# Patient Record
Sex: Female | Born: 1975 | Race: White | Hispanic: No | Marital: Single | Smoking: Never smoker
Health system: Southern US, Community
[De-identification: ages and names within clinical notes are randomized; demographics above are authoritative.]

## PROBLEM LIST (undated history)

## (undated) DIAGNOSIS — R7303 Prediabetes: Secondary | ICD-10-CM

## (undated) DIAGNOSIS — R002 Palpitations: Secondary | ICD-10-CM

## (undated) DIAGNOSIS — E119 Type 2 diabetes mellitus without complications: Secondary | ICD-10-CM

## (undated) DIAGNOSIS — J45909 Unspecified asthma, uncomplicated: Secondary | ICD-10-CM

## (undated) DIAGNOSIS — K219 Gastro-esophageal reflux disease without esophagitis: Secondary | ICD-10-CM

## (undated) HISTORY — DX: Type 2 diabetes mellitus without complications: E11.9

## (undated) HISTORY — PX: CATARACT EXTRACTION: SUR2

## (undated) HISTORY — DX: Palpitations: R00.2

## (undated) HISTORY — DX: Prediabetes: R73.03

---
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
## 2017-09-09 ENCOUNTER — Other Ambulatory Visit (HOSPITAL_COMMUNITY): Payer: Self-pay | Admitting: Family Medicine

## 2017-09-09 DIAGNOSIS — Z1231 Encounter for screening mammogram for malignant neoplasm of breast: Secondary | ICD-10-CM

## 2017-09-29 ENCOUNTER — Other Ambulatory Visit: Payer: Self-pay

## 2017-09-29 ENCOUNTER — Ambulatory Visit (INDEPENDENT_AMBULATORY_CARE_PROVIDER_SITE_OTHER): Payer: Commercial Managed Care - PPO | Admitting: Cardiovascular Disease

## 2017-09-29 ENCOUNTER — Encounter: Payer: Self-pay | Admitting: Cardiovascular Disease

## 2017-09-29 VITALS — BP 124/76 | HR 101 | Ht 65.0 in | Wt 237.0 lb

## 2017-09-29 DIAGNOSIS — Z566 Other physical and mental strain related to work: Secondary | ICD-10-CM | POA: Diagnosis not present

## 2017-09-29 DIAGNOSIS — R002 Palpitations: Secondary | ICD-10-CM | POA: Diagnosis not present
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
## 2017-09-29 NOTE — Patient Instructions (Signed)
Medication Instructions:  Continue all current medications.  Labwork: none  Testing/Procedures:  Your physician has requested that you have an echocardiogram. Echocardiography is a painless test that uses sound waves to create images of your heart. It provides your doctor with information about the size and shape of your heart and how well your heart's chambers and valves are working. This procedure takes approximately one hour. There are no restrictions for this procedure.  Your physician has recommended that you wear a 7 day event monitor. Event monitors are medical devices that record the heart's electrical activity. Doctors most often [place] these monitors to diagnose arrhythmias. Arrhythmias are problems with the speed or rhythm of the heartbeat. The monitor is a small, portable device. You can wear one while you do your normal daily activities. This is usually used to diagnose what is causing palpitations/syncope (passing out).  Office will contact with results via phone or letter.    Follow-Up: Next available   Any Other Special Instructions Will Be Listed Below (If Applicable).  If you need a refill on your cardiac medications before your next appointment, please call your pharmacy.

## 2017-09-30 ENCOUNTER — Other Ambulatory Visit: Payer: Self-pay | Admitting: Cardiovascular Disease

## 2017-09-30 DIAGNOSIS — I493 Ventricular premature depolarization: Secondary | ICD-10-CM

## 2017-10-07 ENCOUNTER — Other Ambulatory Visit: Payer: Commercial Managed Care - PPO

## 2017-10-08 ENCOUNTER — Ambulatory Visit (INDEPENDENT_AMBULATORY_CARE_PROVIDER_SITE_OTHER): Payer: Commercial Managed Care - PPO

## 2017-10-08 ENCOUNTER — Other Ambulatory Visit: Payer: Self-pay

## 2017-10-08 DIAGNOSIS — I493 Ventricular premature depolarization: Secondary | ICD-10-CM | POA: Diagnosis not present

## 2017-10-13 ENCOUNTER — Telehealth: Payer: Self-pay | Admitting: *Deleted

## 2017-10-13 ENCOUNTER — Ambulatory Visit (INDEPENDENT_AMBULATORY_CARE_PROVIDER_SITE_OTHER): Payer: Commercial Managed Care - PPO

## 2017-10-13 DIAGNOSIS — R002 Palpitations: Secondary | ICD-10-CM | POA: Diagnosis not present

## 2017-10-13 NOTE — Telephone Encounter (Signed)
ECHO -  Notes recorded by Arnoldo Lenis, MD on 10/09/2017 at 1:01 PM EDT Normal echo  Zandra Abts MD  LABS -  Notes recorded by Herminio Commons, MD on 10/12/2017 at 11:15 AM EDT Good.

## 2017-10-13 NOTE — Telephone Encounter (Signed)
Notes recorded by Laurine Blazer, LPN on 6/61/9694 at 0:98 PM EDT Patient notified. Copy to pmd. Follow up scheduled for October with Dr. Bronson Ing.

## 2017-10-16 ENCOUNTER — Ambulatory Visit: Payer: Commercial Managed Care - PPO | Admitting: Cardiovascular Disease

## 2017-10-26 ENCOUNTER — Ambulatory Visit (HOSPITAL_COMMUNITY): Payer: Commercial Managed Care - PPO

## 2017-10-30 ENCOUNTER — Ambulatory Visit (HOSPITAL_COMMUNITY): Payer: Commercial Managed Care - PPO

## 2017-11-05 ENCOUNTER — Encounter (HOSPITAL_COMMUNITY): Payer: Self-pay | Admitting: Radiology

## 2017-11-05 ENCOUNTER — Ambulatory Visit (HOSPITAL_COMMUNITY)
Admission: RE | Admit: 2017-11-05 | Discharge: 2017-11-05 | Disposition: A | Discharge status: Home / Self Care | Payer: Commercial Managed Care - PPO | Source: Ambulatory Visit | Attending: Family Medicine | Admitting: Family Medicine

## 2017-11-05 DIAGNOSIS — Z1231 Encounter for screening mammogram for malignant neoplasm of breast: Secondary | ICD-10-CM | POA: Diagnosis present

## 2017-11-09 ENCOUNTER — Encounter: Payer: Self-pay | Admitting: *Deleted

## 2017-11-10 ENCOUNTER — Telehealth: Payer: Self-pay | Admitting: *Deleted

## 2017-11-10 ENCOUNTER — Encounter: Payer: Self-pay | Admitting: Cardiovascular Disease

## 2017-11-10 ENCOUNTER — Ambulatory Visit (INDEPENDENT_AMBULATORY_CARE_PROVIDER_SITE_OTHER): Payer: Commercial Managed Care - PPO | Admitting: Cardiovascular Disease

## 2017-11-10 VITALS — BP 117/74 | HR 94 | Ht 65.0 in | Wt 234.2 lb

## 2017-11-10 DIAGNOSIS — R002 Palpitations: Secondary | ICD-10-CM | POA: Diagnosis not present

## 2017-11-10 DIAGNOSIS — Z23 Encounter for immunization: Secondary | ICD-10-CM | POA: Diagnosis not present

## 2017-11-10 NOTE — Telephone Encounter (Signed)
Notes recorded by Laurine Blazer, LPN on 55/04/7480 at 70:78 AM EDT Patient in office today for follow up visit. Results given by provider. Stated she is actually doing better & will not add any new medications at this time. Copy to pmd. ------  Notes recorded by Laurine Blazer, LPN on 67/06/4490 at 0:10 PM EDT Left message to return call.  ------  Notes recorded by Herminio Commons, MD on 11/03/2017 at 1:12 PM EDT She has a very mildly elevated heart rate at rest. There were fairly frequent extra beats coming from the upper chambers of her heart. Symptoms appear to correlate with these. Start metoprolol tartrate 25 mg twice daily and assess for symptom improvement.
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
## 2017-11-10 NOTE — Patient Instructions (Signed)

## 2018-03-16 ENCOUNTER — Ambulatory Visit: Payer: Commercial Managed Care - PPO | Admitting: Cardiovascular Disease

## 2018-05-06 ENCOUNTER — Telehealth: Payer: Self-pay | Admitting: *Deleted

## 2018-05-06 ENCOUNTER — Telehealth: Payer: Self-pay

## 2018-05-06 NOTE — Telephone Encounter (Signed)
-----   Message from Merlene Laughter, RN sent at 05/06/2018  2:25 PM EDT ----- Regarding: Need webex set up for this patients appt with Dr. Raliegh Ip on 04/07

## 2018-05-06 NOTE — Telephone Encounter (Signed)
Telehealth video apt has been scheduled for patient on 05/11/18 at 1 pm

## 2018-05-06 NOTE — Telephone Encounter (Signed)
   Cardiac Questionnaire:    Since your last visit or hospitalization:    1. Have you been having new or worsening chest pain? No   2. Have you been having new or worsening shortness of breath? No 3. Have you been having new or worsening leg swelling, wt gain, or increase in abdominal girth (pants fitting more tightly)? No   4. Have you had any passing out spells? No    Given the current situation regarding the worldwide coronarvirus pandemic, at the recommendation of the CDC, we are looking to limit gatherings in our waiting area, and thus will schedule their appointment for a virtual visit.

## 2018-05-11 ENCOUNTER — Encounter: Payer: Self-pay | Admitting: Cardiovascular Disease

## 2018-05-11 ENCOUNTER — Telehealth (INDEPENDENT_AMBULATORY_CARE_PROVIDER_SITE_OTHER): Payer: Commercial Managed Care - PPO | Admitting: Cardiovascular Disease

## 2018-05-11 DIAGNOSIS — J45909 Unspecified asthma, uncomplicated: Secondary | ICD-10-CM | POA: Diagnosis not present

## 2018-05-11 DIAGNOSIS — R002 Palpitations: Secondary | ICD-10-CM

## 2018-05-11 MED ORDER — ALBUTEROL SULFATE HFA 108 (90 BASE) MCG/ACT IN AERS: 2.0000 | INHALATION_SPRAY | Freq: Four times a day (QID) | RESPIRATORY_TRACT | 0 refills | Status: DC | PRN

## 2018-05-11 NOTE — Progress Notes (Signed)
Virtual Visit via Video Note   This visit type was conducted due to national recommendations for restrictions regarding the COVID-19 Pandemic (e.g. social distancing) in an effort to limit this patient's exposure and mitigate transmission in our community.  Due to her co-morbid illnesses, this patient is at least at moderate risk for complications without adequate follow up.  This format is felt to be most appropriate for this patient at this time.  All issues noted in this document were discussed and addressed.  A limited physical exam was performed with this format.  Please refer to the patient's chart for her consent to telehealth for [facility].   Evaluation Performed:  Follow-up visit  Date:  05/11/2018   ID:  Kathleen Stokes, DOB 30-Jan-1976, MRN [id number]  Patient Location: Home  Provider Location: Home  PCP:  Sharilyn Sites, MD  Cardiologist:  Bronson Ing MD Electrophysiologist:  None   Chief Complaint:  sob  History of Present Illness:    Kathleen Stokes is a 43 y.o. female who presents via audio/video conferencing for a telehealth visit today.    She has a h/o palpitations but has not required medical therapy to date.  The patient denies any symptoms of chest pain, palpitations, shortness of breath, lightheadedness, dizziness, leg swelling, orthopnea, PND, and syncope.  She has asthma and requests a refill of her inhaler.   The patient does not have symptoms concerning for COVID-19 infection (fever, chills, cough, or new shortness of breath).    Past Medical History:  Diagnosis Date  . Palpitations    History reviewed. No pertinent surgical history.   Current Meds  Medication Sig  . esomeprazole (NEXIUM) 20 MG capsule Take 20 mg by mouth daily at 12 noon.  . fluticasone (FLONASE) 50 MCG/ACT nasal spray Place into both nostrils daily.  Marland Kitchen liraglutide (VICTOZA) 18 MG/3ML SOPN Inject 1.8 mg into the skin daily.      Allergies:   Penicillins   Social History    Tobacco Use  . Smoking status: Never Smoker  . Smokeless tobacco: Never Used  Substance Use Topics  . Alcohol use: Not on file  . Drug use: Not on file     Family Hx: The patient's family history includes Heart Problems in her mother.  ROS:   Please see the history of present illness.     All other systems reviewed and are negative.   Prior CV studies:   The following studies were reviewed today:  Echocardiogram on 10/08/2017 was normal, LVEF 60 to 65%.  Event monitoring demonstrated sinus rhythm and sinus tachycardia with frequent PACs and isolated PVCs with an average heart rate of 103 bpm.  Symptoms correlated with sinus tachycardia and PACs.  Labs/Other Tests and Data Reviewed:    EKG:  None  Recent Labs: No results found for requested labs within last 8760 hours.   Recent Lipid Panel No results found for: CHOL, TRIG, HDL, CHOLHDL, LDLCALC, LDLDIRECT  Wt Readings from Last 3 Encounters:  05/11/18 251 lb (113.9 kg)  11/10/17 234 lb 3.2 oz (106.2 kg)  09/29/17 237 lb (107.5 kg)     Objective:    Vital Signs:  Ht 5\' 5"  (1.651 m)   Wt 251 lb (113.9 kg)   BMI 41.77 kg/m    HEENT: eomi, [place]uma/at Gen: NAD  ASSESSMENT & PLAN:    1.  Palpitations: Symptomatically stable without medical therapy.  Event monitor reviewed above with sinus tachycardia and frequent PACs.  If she has recurrence, I would  consider starting metoprolol 25 mg twice daily.  She has normal cardiac function as demonstrated by echocardiography reviewed above.  2. Asthma: I will refill her inhaler as per her request.  COVID-19 Education: The signs and symptoms of COVID-19 were discussed with the patient and how to seek care for testing (follow up with PCP or arrange E-visit).  The importance of social distancing was discussed today.  Time:   Today, I have spent 15 minutes with the patient with telehealth technology discussing the above problems.     Medication Adjustments/Labs and Tests Ordered:  Current medicines are reviewed at length with the patient today.  Concerns regarding medicines are outlined above.  Tests Ordered: No orders of the defined types were placed in this encounter.  Medication Changes: No orders of the defined types were placed in this encounter.   Disposition:  Follow up prn  Signed, Kate Sable, MD  05/11/2018 11:55 AM    [place]

## 2018-05-11 NOTE — Addendum Note (Signed)
Addended by: Barbarann Ehlers A on: 05/11/2018 12:35 PM   Modules accepted: Orders

## 2018-05-12 ENCOUNTER — Ambulatory Visit: Payer: Commercial Managed Care - PPO | Admitting: Cardiovascular Disease

## 2018-11-01 ENCOUNTER — Other Ambulatory Visit (HOSPITAL_COMMUNITY): Payer: Self-pay | Admitting: Family Medicine

## 2018-11-01 DIAGNOSIS — R1011 Right upper quadrant pain: Secondary | ICD-10-CM

## 2018-11-17 ENCOUNTER — Encounter (HOSPITAL_COMMUNITY): Payer: Self-pay

## 2018-11-17 ENCOUNTER — Ambulatory Visit (HOSPITAL_COMMUNITY)
Admission: RE | Admit: 2018-11-17 | Discharge: 2018-11-17 | Disposition: A | Discharge status: Home / Self Care | Payer: Commercial Managed Care - PPO | Source: Ambulatory Visit | Attending: Family Medicine | Admitting: Family Medicine

## 2018-11-17 ENCOUNTER — Other Ambulatory Visit: Payer: Self-pay

## 2018-11-17 DIAGNOSIS — R1011 Right upper quadrant pain: Secondary | ICD-10-CM | POA: Insufficient documentation

## 2018-11-17 HISTORY — DX: Unspecified asthma, uncomplicated: J45.909

## 2018-11-17 MED ORDER — TECHNETIUM TC 99M MEBROFENIN IV KIT
5.0000 | PACK | Freq: Once | INTRAVENOUS | Status: AC | PRN
  Administered 2018-11-17: 5 via INTRAVENOUS

## 2018-12-02 ENCOUNTER — Other Ambulatory Visit: Payer: Self-pay

## 2018-12-02 ENCOUNTER — Ambulatory Visit (INDEPENDENT_AMBULATORY_CARE_PROVIDER_SITE_OTHER): Payer: Commercial Managed Care - PPO | Admitting: General Surgery

## 2018-12-02 ENCOUNTER — Encounter: Payer: Self-pay | Admitting: General Surgery

## 2018-12-02 VITALS — BP 109/73 | HR 75 | Temp 98.5°F | Resp 16 | Ht 65.0 in | Wt 255.0 lb

## 2018-12-02 DIAGNOSIS — K828 Other specified diseases of gallbladder: Secondary | ICD-10-CM | POA: Diagnosis not present

## 2018-12-02 NOTE — Patient Instructions (Signed)
Kathleen Stokes  12/02/2018     @PREFPERIOPPHARMACY @   Your procedure is scheduled on  12/08/2018 .  Report to [facility] at  [place]  A.M.  Call this number if you have problems the morning of surgery:  [phone]   Remember:  Do not eat or drink after midnight.                      Take these medicines the morning of surgery with A SIP OF WATER  Nexium    Do not wear jewelry, make-up or nail polish.  Do not wear lotions, powders, or perfumes. Please wear deodorant and brush your teeth.  Do not shave 48 hours prior to surgery.  Men may shave face and neck.  Do not bring valuables to the hospital.  [facility] is not responsible for any belongings or valuables.  Contacts, dentures or bridgework may not be worn into surgery.  Leave your suitcase in the car.  After surgery it may be brought to your room.  For patients admitted to the hospital, discharge time will be determined by your treatment team.  Patients discharged the day of surgery will not be allowed to drive home.   Name and phone number of your driver:   family Special instructions:  None  Please read over the following fact sheets that you were given. Anesthesia Post-op Instructions and Care and Recovery After Surgery       Laparoscopic Cholecystectomy, Care After This sheet gives you information about how to care for yourself after your procedure. Your health care provider may also give you more specific instructions. If you have problems or questions, contact your health care provider. What can I expect after the procedure? After the procedure, it is common to have:  Pain at your incision sites. You will be given medicines to control this pain.  Mild nausea or vomiting.  Bloating and possible shoulder pain from the air-like gas that was used during the procedure. Follow these instructions at home: Incision care   Follow instructions from your health care provider about how to take care of  your incisions. Make sure you: ? Wash your hands with soap and water before you change your bandage (dressing). If soap and water are not available, use hand sanitizer. ? Change your dressing as told by your health care provider. ? Leave stitches (sutures), skin glue, or adhesive strips in place. These skin closures may need to be in place for 2 weeks or longer. If adhesive strip edges start to loosen and curl up, you may trim the loose edges. Do not remove adhesive strips completely unless your health care provider tells you to do that.  Do not take baths, swim, or use a hot tub until your health care provider approves. Ask your health care provider if you can take showers. You may only be allowed to take sponge baths for bathing.  Check your incision area every day for signs of infection. Check for: ? More redness, swelling, or pain. ? More fluid or blood. ? Warmth. ? Pus or a bad smell. Activity  Do not drive or use heavy machinery while taking prescription pain medicine.  Do not lift anything that is heavier than 10 lb (4.5 kg) until your health care provider approves.  Do not play contact sports until your health care provider approves.  Do not drive for 24 hours if you were given a medicine to help  you relax (sedative).  Rest as needed. Do not return to work or school until your health care provider approves. General instructions  Take over-the-counter and prescription medicines only as told by your health care provider.  To prevent or treat constipation while you are taking prescription pain medicine, your health care provider may recommend that you: ? Drink enough fluid to keep your urine clear or pale yellow. ? Take over-the-counter or prescription medicines. ? Eat foods that are high in fiber, such as fresh fruits and vegetables, whole grains, and beans. ? Limit foods that are high in fat and processed sugars, such as fried and sweet foods. Contact a health care provider if:   You develop a rash.  You have more redness, swelling, or pain around your incisions.  You have more fluid or blood coming from your incisions.  Your incisions feel warm to the touch.  You have pus or a bad smell coming from your incisions.  You have a fever.  One or more of your incisions breaks open. Get help right away if:  You have trouble breathing.  You have chest pain.  You have increasing pain in your shoulders.  You faint or feel dizzy when you stand.  You have severe pain in your abdomen.  You have nausea or vomiting that lasts for more than one day.  You have leg pain. This information is not intended to replace advice given to you by your health care provider. Make sure you discuss any questions you have with your health care provider. Document Released: 01/20/2005 Document Revised: 01/02/2017 Document Reviewed: 07/09/2015 Elsevier Patient Education  2020 [employer] Anesthesia, Adult, Care After This sheet gives you information about how to care for yourself after your procedure. Your health care provider may also give you more specific instructions. If you have problems or questions, contact your health care provider. What can I expect after the procedure? After the procedure, the following side effects are common:  Pain or discomfort at the IV site.  Nausea.  Vomiting.  Sore throat.  Trouble concentrating.  Feeling cold or chills.  Weak or tired.  Sleepiness and fatigue.  Soreness and body aches. These side effects can affect parts of the body that were not involved in surgery. Follow these instructions at home:  For at least 24 hours after the procedure:  Have a responsible adult stay with you. It is important to have someone help care for you until you are awake and alert.  Rest as needed.  Do not: ? Participate in activities in which you could fall or become injured. ? Drive. ? Use heavy machinery. ? Drink alcohol. ?  Take sleeping pills or medicines that cause drowsiness. ? Make important decisions or sign legal documents. ? Take care of children on your own. Eating and drinking  Follow any instructions from your health care provider about eating or drinking restrictions.  When you feel hungry, start by eating small amounts of foods that are soft and easy to digest (bland), such as toast. Gradually return to your regular diet.  Drink enough fluid to keep your urine pale yellow.  If you vomit, rehydrate by drinking water, juice, or clear broth. General instructions  If you have sleep apnea, surgery and certain medicines can increase your risk for breathing problems. Follow instructions from your health care provider about wearing your sleep device: ? Anytime you are sleeping, including during daytime naps. ? While taking prescription pain medicines, sleeping medicines, or medicines  that make you drowsy.  Return to your normal activities as told by your health care provider. Ask your health care provider what activities are safe for you.  Take over-the-counter and prescription medicines only as told by your health care provider.  If you smoke, do not smoke without supervision.  Keep all follow-up visits as told by your health care provider. This is important. Contact a health care provider if:  You have nausea or vomiting that does not get better with medicine.  You cannot eat or drink without vomiting.  You have pain that does not get better with medicine.  You are unable to pass urine.  You develop a skin rash.  You have a fever.  You have redness around your IV site that gets worse. Get help right away if:  You have difficulty breathing.  You have chest pain.  You have blood in your urine or stool, or you vomit blood. Summary  After the procedure, it is common to have a sore throat or nausea. It is also common to feel tired.  Have a responsible adult stay with you for the first  24 hours after general anesthesia. It is important to have someone help care for you until you are awake and alert.  When you feel hungry, start by eating small amounts of foods that are soft and easy to digest (bland), such as toast. Gradually return to your regular diet.  Drink enough fluid to keep your urine pale yellow.  Return to your normal activities as told by your health care provider. Ask your health care provider what activities are safe for you. This information is not intended to replace advice given to you by your health care provider. Make sure you discuss any questions you have with your health care provider. Document Released: 04/28/2000 Document Revised: 01/23/2017 Document Reviewed: 09/05/2016 Elsevier Patient Education  2020 [employer]. How to Use Chlorhexidine for Bathing Chlorhexidine gluconate (CHG) is a germ-killing (antiseptic) solution that is used to clean the skin. It can get rid of the bacteria that normally live on the skin and can keep them away for about 24 hours. To clean your skin with CHG, you may be given:  A CHG solution to use in the shower or as part of a sponge bath.  A prepackaged cloth that contains CHG. Cleaning your skin with CHG may help lower the risk for infection:  While you are staying in the intensive care unit of the hospital.  If you have a vascular access, such as a central line, to provide short-term or long-term access to your veins.  If you have a catheter to drain urine from your bladder.  If you are on a ventilator. A ventilator is a machine that helps you breathe by moving air in and out of your lungs.  After surgery. What are the risks? Risks of using CHG include:  A skin reaction.  Hearing loss, if CHG gets in your ears.  Eye injury, if CHG gets in your eyes and is not rinsed out.  The CHG product catching fire. Make sure that you avoid smoking and flames after applying CHG to your skin. Do not use CHG:  If you  have a chlorhexidine allergy or have previously reacted to chlorhexidine.  On babies younger than 66 months of age. How to use CHG solution  Use CHG only as told by your health care provider, and follow the instructions on the label.  Use the full amount of CHG as  directed. Usually, this is one bottle. During a shower Follow these steps when using CHG solution during a shower (unless your health care provider gives you different instructions): 1. Start the shower. 2. Use your normal soap and shampoo to wash your face and hair. 3. Turn off the shower or move out of the shower stream. 4. Pour the CHG onto a clean washcloth. Do not use any type of brush or rough-edged sponge. 5. Starting at your neck, lather your body down to your toes. Make sure you follow these instructions: ? If you will be having surgery, pay special attention to the part of your body where you will be having surgery. Scrub this area for at least 1 minute. ? Do not use CHG on your head or face. If the solution gets into your ears or eyes, rinse them well with water. ? Avoid your genital area. ? Avoid any areas of skin that have broken skin, cuts, or scrapes. ? Scrub your back and under your arms. Make sure to wash skin folds. 6. Let the lather sit on your skin for 1-2 minutes or as long as told by your health care provider. 7. Thoroughly rinse your entire body in the shower. Make sure that all body creases and crevices are rinsed well. 8. Dry off with a clean towel. Do not put any substances on your body afterward-such as powder, lotion, or perfume-unless you are told to do so by your health care provider. Only use lotions that are recommended by the manufacturer. 9. Put on clean clothes or pajamas. 10. If it is the night before your surgery, sleep in clean sheets.  During a sponge bath Follow these steps when using CHG solution during a sponge bath (unless your health care provider gives you different instructions): 1.  Use your normal soap and shampoo to wash your face and hair. 2. Pour the CHG onto a clean washcloth. 3. Starting at your neck, lather your body down to your toes. Make sure you follow these instructions: ? If you will be having surgery, pay special attention to the part of your body where you will be having surgery. Scrub this area for at least 1 minute. ? Do not use CHG on your head or face. If the solution gets into your ears or eyes, rinse them well with water. ? Avoid your genital area. ? Avoid any areas of skin that have broken skin, cuts, or scrapes. ? Scrub your back and under your arms. Make sure to wash skin folds. 4. Let the lather sit on your skin for 1-2 minutes or as long as told by your health care provider. 5. Using a different clean, wet washcloth, thoroughly rinse your entire body. Make sure that all body creases and crevices are rinsed well. 6. Dry off with a clean towel. Do not put any substances on your body afterward-such as powder, lotion, or perfume-unless you are told to do so by your health care provider. Only use lotions that are recommended by the manufacturer. 7. Put on clean clothes or pajamas. 8. If it is the night before your surgery, sleep in clean sheets. How to use CHG prepackaged cloths  Only use CHG cloths as told by your health care provider, and follow the instructions on the label.  Use the CHG cloth on clean, dry skin.  Do not use the CHG cloth on your head or face unless your health care provider tells you to.  When washing with the CHG cloth: ? Avoid  your genital area. ? Avoid any areas of skin that have broken skin, cuts, or scrapes. Before surgery Follow these steps when using a CHG cloth to clean before surgery (unless your health care provider gives you different instructions): 1. Using the CHG cloth, vigorously scrub the part of your body where you will be having surgery. Scrub using a back-and-forth motion for 3 minutes. The area on your body  should be completely wet with CHG when you are done scrubbing. 2. Do not rinse. Discard the cloth and let the area air-dry. Do not put any substances on the area afterward, such as powder, lotion, or perfume. 3. Put on clean clothes or pajamas. 4. If it is the night before your surgery, sleep in clean sheets.  For general bathing Follow these steps when using CHG cloths for general bathing (unless your health care provider gives you different instructions). 1. Use a separate CHG cloth for each area of your body. Make sure you wash between any folds of skin and between your fingers and toes. Wash your body in the following order, switching to a new cloth after each step: ? The front of your neck, shoulders, and chest. ? Both of your arms, under your arms, and your hands. ? Your stomach and groin area, avoiding the genitals. ? Your right leg and foot. ? Your left leg and foot. ? The back of your neck, your back, and your buttocks. 2. Do not rinse. Discard the cloth and let the area air-dry. Do not put any substances on your body afterward-such as powder, lotion, or perfume-unless you are told to do so by your health care provider. Only use lotions that are recommended by the manufacturer. 3. Put on clean clothes or pajamas. Contact a health care provider if:  Your skin gets irritated after scrubbing.  You have questions about using your solution or cloth. Get help right away if:  Your eyes become very red or swollen.  Your eyes itch badly.  Your skin itches badly and is red or swollen.  Your hearing changes.  You have trouble seeing.  You have swelling or tingling in your mouth or throat.  You have trouble breathing.  You swallow any chlorhexidine. Summary  Chlorhexidine gluconate (CHG) is a germ-killing (antiseptic) solution that is used to clean the skin. Cleaning your skin with CHG may help to lower your risk for infection.  You may be given CHG to use for bathing. It may  be in a bottle or in a prepackaged cloth to use on your skin. Carefully follow your health care provider's instructions and the instructions on the product label.  Do not use CHG if you have a chlorhexidine allergy.  Contact your health care provider if your skin gets irritated after scrubbing. This information is not intended to replace advice given to you by your health care provider. Make sure you discuss any questions you have with your health care provider. Document Released: 10/15/2011 Document Revised: 04/08/2018 Document Reviewed: 12/18/2016 Elsevier Patient Education  2020 [employer].

## 2018-12-02 NOTE — Patient Instructions (Signed)
Biliary Dyskinesia  Biliary dyskinesia is a condition in which the gallbladder or bile ducts cannot release or move bile normally. Bile is a fluid that is made in the liver. It helps the body to digest food. Bile flows to the gallbladder to be stored. When bile is needed for digestion, it leaves the gallbladder and flows through the bile ducts into the digestive tract. Biliary dyskinesia causes bile to build up, and that can cause abdomen (abdominal)pain. This condition may also be called:  Acalculous cholecystopathy.  Functional gallbladder disorder.  Sphincter of Oddi dysfunction. All these names describe gallbladder disorders that are not caused by gallbladder stones. Sphincter of Oddi dysfunction happens in the area where the duct for digestive juices from the pancreas (pancreatic duct) joins the bile duct before emptying into the digestive tract. If the opening into the digestive tract is too small, bile and pancreatic juices may back up and cause abdominal pain. What are the causes? The cause of biliary dyskinesia is poor function of the gallbladder. The exact reason this happens is often unknown. One reason may be changes in the gallbladder that are caused by obesity. What increases the risk? You are more likely to develop this condition if your mother or father had it, or if you are:  Overweight.  Female.  57-68 years old. What are the signs or symptoms? The main symptom of this condition is pain in the upper right side of the abdomen. Typically, the pain:  Starts about 30 minutes after a meal, especially a meal that is spicy or greasy.  Lasts for 30 minutes or longer.  Builds up gradually until it is a steady pain that is severe enough to interrupt daily activities. Other symptoms may include:  Nausea.  Vomiting.  Cramping.  Bloating.  Heartburn.  Diarrhea. How is this diagnosed? This condition may be diagnosed based on your symptoms, your medical history, and a  physical exam. You may have tests to rule out other conditions and to confirm the diagnosis. These may include:  Blood tests.  Ultrasound tests of the gallbladder.  HIDA scan. This is an X-ray test that can show if your gallbladder empties less than a normal amount of bile (gallbladder ejection fraction).  MRI or CT scan of the abdomen.  ERCP (endoscopic retrograde cholangiopancreatogram). During this procedure, a thin tube with a camera on the end is inserted into the throat and down into areas that need examination, such as the pancreas, bile ducts, liver, and gallbladder. Dye is injected into your blood, then X-rays are done. The dye helps your health care provider to see the areas that need examination. ERCP may be done to help diagnose sphincter of Oddi dysfunction. How is this treated? Treatment depends on the cause. The first step of treatment is usually to make lifestyle changes. Your health care provider may recommend:  Taking over-the-counter or prescription pain medicine.  Resting.  Losing weight.  Avoiding foods that are spicy or greasy. In some cases, the condition gets better with lifestyle changes only. However, in many cases, surgery to remove the gallbladder (cholecystectomy) is necessary. Follow these instructions at home:  Take over-the-counter and prescription medicines only as told by your health care provider.  Do not drive or use heavy machinery while taking prescription pain medicine.  If you are taking prescription pain medicine, take actions to prevent or treat constipation. Your health care provider may recommend that you: ? Drink enough fluid to keep your urine pale yellow. ? Eat foods that  are high in fiber, such as fresh fruits and vegetables, whole grains, and beans. ? Limit foods that are high in fat and processed sugars, such as fried or sweet foods. ? Take an over-the-counter or prescription medicine for constipation.  Rest and return to your normal  activities as told by your health care provider. Ask your health care provider what activities are safe for you.  Follow instructions from your health care provider about eating or drinking restrictions. You may need to limit fatty, greasy, and spicy foods if they cause symptoms.  Limit alcohol intake to no more than 1 drink a day for nonpregnant women and 2 drinks a day for men. One drink equals 12 oz of beer, 5 oz of wine, or 1 oz of hard liquor. Alcohol can irritate your stomach and your liver.  Do not use any products that contain nicotine or tobacco, such as cigarettes and e-cigarettes. If you need help quitting, ask your health care provider. Smoking can damage your digestive system.  Keep all follow-up visits as told by your health care provider. This is important. Contact a health care provider if:  Your abdominal pain comes back.  You have any of the following: ? Nausea. ? Vomiting. ? Diarrhea. ? Cramping. ? Bloating. ? Diarrhea. Summary  Biliary dyskinesia is a condition in which your gallbladder or bile ducts cannot release or move bile normally.  The main symptom of this condition is pain in the upper right side of the abdomen. The pain typically starts about 30 minutes after a meal, especially a meal that is spicy or greasy.  Treatment may include lifestyle changes, such as working to lose weight and avoiding certain foods. In many cases, surgery to remove the gallbladder (cholecystectomy) is necessary. This information is not intended to replace advice given to you by your health care provider. Make sure you discuss any questions you have with your health care provider. Document Released: 10/23/2016 Document Revised: 01/02/2017 Document Reviewed: 10/23/2016 Elsevier Patient Education  2020 [employer].   Laparoscopic Cholecystectomy Laparoscopic cholecystectomy is surgery to remove the gallbladder. The gallbladder is a pear-shaped organ that lies beneath the liver on  the right side of the body. The gallbladder stores bile, which is a fluid that helps the body to digest fats. Cholecystectomy is often done for inflammation of the gallbladder (cholecystitis). This condition is usually caused by a buildup of gallstones (cholelithiasis) in the gallbladder. Gallstones can block the flow of bile, which can result in inflammation and pain. In severe cases, emergency surgery may be required. This procedure is done though small incisions in your abdomen (laparoscopic surgery). A thin scope with a camera (laparoscope) is inserted through one incision. Thin surgical instruments are inserted through the other incisions. In some cases, a laparoscopic procedure may be turned into a type of surgery that is done through a larger incision (open surgery). Tell a health care provider about:  Any allergies you have.  All medicines you are taking, including vitamins, herbs, eye drops, creams, and over-the-counter medicines.  Any problems you or family members have had with anesthetic medicines.  Any blood disorders you have.  Any surgeries you have had.  Any medical conditions you have.  Whether you are pregnant or may be pregnant. What are the risks? Generally, this is a safe procedure. However, problems may occur, including:  Infection.  Bleeding.  Allergic reactions to medicines.  Damage to other structures or organs.  A stone remaining in the common bile duct. The  common bile duct carries bile from the gallbladder into the small intestine.  A bile leak from the cyst duct that is clipped when your gallbladder is removed. Medicines  Ask your health care provider about: ? Changing or stopping your regular medicines. This is especially important if you are taking diabetes medicines or blood thinners. ? Taking medicines such as aspirin and ibuprofen. These medicines can thin your blood. Do not take these medicines before your procedure if your health care provider  instructs you not to.  You may be given antibiotic medicine to help prevent infection. General instructions  Let your health care provider know if you develop a cold or an infection before surgery.  Plan to have someone take you home from the hospital or clinic.  Ask your health care provider how your surgical site will be marked or identified. What happens during the procedure?   To reduce your risk of infection: ? Your health care team will wash or sanitize their hands. ? Your skin will be washed with soap. ? Hair may be removed from the surgical area.  An IV tube may be inserted into one of your veins.  You will be given one or more of the following: ? A medicine to help you relax (sedative). ? A medicine to make you fall asleep (general anesthetic).  A breathing tube will be placed in your mouth.  Your surgeon will make several small cuts (incisions) in your abdomen.  The laparoscope will be inserted through one of the small incisions. The camera on the laparoscope will send images to a TV screen (monitor) in the operating room. This lets your surgeon see inside your abdomen.  Air-like gas will be pumped into your abdomen. This will expand your abdomen to give the surgeon more room to perform the surgery.  Other tools that are needed for the procedure will be inserted through the other incisions. The gallbladder will be removed through one of the incisions.  Your common bile duct may be examined. If stones are found in the common bile duct, they may be removed.  After your gallbladder has been removed, the incisions will be closed with stitches (sutures), staples, or skin glue.  Your incisions may be covered with a bandage (dressing). The procedure may vary among health care providers and hospitals. What happens after the procedure?  Your blood pressure, heart rate, breathing rate, and blood oxygen level will be monitored until the medicines you were given have worn off.   You will be given medicines as needed to control your pain.  Do not drive for 24 hours if you were given a sedative. This information is not intended to replace advice given to you by your health care provider. Make sure you discuss any questions you have with your health care provider. Document Released: 01/20/2005 Document Revised: 01/02/2017 Document Reviewed: 07/09/2015 Elsevier Patient Education  2020 [employer].

## 2018-12-02 NOTE — Progress Notes (Signed)
[facility] History and Physical  Reason for Referral: Biliary Dyskinesia  Referring Physician:  Delman Cheadle, PA   Chief Complaint    Abdominal Pain      Kathleen Stokes is a 43 y.o. female.  HPI: Kathleen Stokes is a 43 yo who has recently started having episodes of epigastric/ RUQ pain and associated nausea and vomiting. She said that in August she started having these episodes and feeling bloated. She went to her PCP and was worked up with a HIDA scan. She says that the pain improves after having some gas, and that she noticed other episodes the months leading up to these last attack.  She reports a long history of diarrhea.  She does not know of any gallstones.  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   Past Medical History:  Diagnosis Date  . Asthma   . Palpitations   . Prediabetes     Past Surgical History:  Procedure Laterality Date  . CATARACT EXTRACTION      Family History  Problem Relation Age of Onset  . Heart Problems Mother   . Gallbladder disease Mother     Social History   Tobacco Use  . Smoking status: Never Smoker  . Smokeless tobacco: Never Used  Substance Use Topics  . Alcohol use: Not on file  . Drug use: Not on file    Medications: I have reviewed the patient's current medications. Allergies as of 12/02/2018      Reactions   Penicillins Rash      Medication List       Accurate as of December 02, 2018  1:50 PM. If you have any questions, ask your nurse or doctor.        albuterol 108 (90 Base) MCG/ACT inhaler Commonly known as: VENTOLIN HFA Inhale 2 puffs into the lungs every 6 (six) hours as needed for wheezing or shortness of breath.   esomeprazole 20 MG capsule Commonly known as: NEXIUM Take 20 mg by mouth daily at 12 noon.   fluticasone 50 MCG/ACT nasal spray Commonly known as: FLONASE Place into both nostrils daily.   Victoza 18 MG/3ML Sopn Generic drug: liraglutide Inject 1.8 mg into  the skin daily.        ROS:  A comprehensive review of systems was negative except for: Gastrointestinal: positive for abdominal pain, diarrhea, nausea, reflux symptoms and vomiting Genitourinary: positive for urinary incontinence Neurological: positive for dizziness  Blood pressure 109/73, pulse 75, temperature 98.5 F (36.9 C), temperature source Oral, resp. rate 16, height 5' 5"  (1.651 m), weight 255 lb (115.7 kg), SpO2 96 %. Physical Exam Vitals signs reviewed.  Constitutional:      Appearance: She is well-developed.  HENT:     Head: Normocephalic and atraumatic.  Eyes:     Extraocular Movements: Extraocular movements intact.     Pupils: Pupils are equal, round, and reactive to light.  Cardiovascular:     Rate and Rhythm: Normal rate and regular rhythm.  Pulmonary:     Effort: Pulmonary effort is normal.     Breath sounds: Normal breath sounds.  Abdominal:     General: Abdomen is flat. There is no distension.     Tenderness: There is abdominal tenderness in the right upper quadrant and epigastric area.     Comments: Some discomfort with deep palpation  Musculoskeletal:     Comments: Moves all extremities, no edema  Skin:    General: Skin is warm and  dry.  Neurological:     General: No focal deficit present.     Mental Status: She is alert and oriented to person, place, and time.  Psychiatric:        Mood and Affect: Mood normal.        Behavior: Behavior normal.     Results: HIDA 11/2018 EXAM: NUCLEAR MEDICINE HEPATOBILIARY IMAGING WITH GALLBLADDER EF  VIEWS: Anterior right upper quadrant  RADIOPHARMACEUTICALS:  5.0 mCi Tc-63mmebrofenin IV  COMPARISON:  None.  FINDINGS: Liver uptake of radiotracer is unremarkable. There is prompt visualization of gallbladder and small bowel, indicating patency of the cystic and common bile ducts. Patient consumed 8 ounces of Ensure Plus orally with calculation of the computer generated ejection fraction of  radiotracer from the gallbladder. The patient did not have clinical symptoms with the Ensure consumption. The computer generated ejection fraction of radiotracer from the gallbladder is abnormally low at 26%, normal greater than 33% using the oral agent.  IMPRESSION: Abnormally low ejection fraction of radiotracer from the gallbladder, a finding indicative of biliary dyskinesia. Cystic and common bile ducts are patent as is evidenced by visualization of gallbladder and small bowel.   OSH Labs-WBC 11, H&H 12.9/39.2, Platelets 415; Cr 0.88, T bil 0.3, Alk phos 204H, ALT 25, AST 25, Albumin 4  Assessment & Plan:  TOdesser Tourangeauis a 43y.o. female with biliary dyskinesia on the HIDA scan. I am unsure if she has any gallstones, but her labs do not show any elevated T bilirubin. I do not think we need to pursue any additional testing.  -PLAN: I counseled the patient about the indication, risks and benefits of laparoscopic cholecystectomy.  She understands there is a very small chance for bleeding, infection, injury to normal structures (including common bile duct), conversion to open surgery, persistent symptoms, evolution of postcholecystectomy diarrhea, need for secondary interventions, anesthesia reaction, cardiopulmonary issues and other risks not specifically detailed here. I described the expected recovery, the plan for follow-up and the restrictions during the recovery phase.  All questions were answered.   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    LVirl Cagey10/29/2020, 1:50 PM

## 2018-12-05 DIAGNOSIS — K828 Other specified diseases of gallbladder: Secondary | ICD-10-CM | POA: Insufficient documentation

## 2018-12-05 NOTE — H&P (Signed)
[facility] History and Physical  Reason for Referral: Biliary Dyskinesia  Referring Physician:  Delman Cheadle, PA      Chief Complaint    Abdominal Pain      Kathleen Stokes is a 43 y.o. female.  HPI: Kathleen Stokes is a 43 yo who has recently started having episodes of epigastric/ RUQ pain and associated nausea and vomiting. She said that in August she started having these episodes and feeling bloated. She went to her PCP and was worked up with a HIDA scan. She says that the pain improves after having some gas, and that she noticed other episodes the months leading up to these last attack.  She reports a long history of diarrhea.  She does not know of any gallstones.  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       Past Medical History:  Diagnosis Date  . Asthma   . Palpitations   . Prediabetes          Past Surgical History:  Procedure Laterality Date  . CATARACT EXTRACTION           Family History  Problem Relation Age of Onset  . Heart Problems Mother   . Gallbladder disease Mother     Social History       Tobacco Use  . Smoking status: Never Smoker  . Smokeless tobacco: Never Used  Substance Use Topics  . Alcohol use: Not on file  . Drug use: Not on file    Medications: I have reviewed the patient's current medications.      Allergies as of 12/02/2018      Reactions   Penicillins Rash         Medication List       Accurate as of December 02, 2018  1:50 PM. If you have any questions, ask your nurse or doctor.        albuterol 108 (90 Base) MCG/ACT inhaler Commonly known as: VENTOLIN HFA Inhale 2 puffs into the lungs every 6 (six) hours as needed for wheezing or shortness of breath.   esomeprazole 20 MG capsule Commonly known as: NEXIUM Take 20 mg by mouth daily at 12 noon.   fluticasone 50 MCG/ACT nasal spray Commonly known as: FLONASE Place into both nostrils daily.    Victoza 18 MG/3ML Sopn Generic drug: liraglutide Inject 1.8 mg into the skin daily.        ROS:  A comprehensive review of systems was negative except for: Gastrointestinal: positive for abdominal pain, diarrhea, nausea, reflux symptoms and vomiting Genitourinary: positive for urinary incontinence Neurological: positive for dizziness  Blood pressure 109/73, pulse 75, temperature 98.5 F (36.9 C), temperature source Oral, resp. rate 16, height 5' 5"  (1.651 m), weight 255 lb (115.7 kg), SpO2 96 %. Physical Exam Vitals signs reviewed.  Constitutional:      Appearance: She is well-developed.  HENT:     Head: Normocephalic and atraumatic.  Eyes:     Extraocular Movements: Extraocular movements intact.     Pupils: Pupils are equal, round, and reactive to light.  Cardiovascular:     Rate and Rhythm: Normal rate and regular rhythm.  Pulmonary:     Effort: Pulmonary effort is normal.     Breath sounds: Normal breath sounds.  Abdominal:     General: Abdomen is flat. There is no distension.     Tenderness: There is abdominal tenderness in the right upper quadrant and epigastric area.  Comments: Some discomfort with deep palpation  Musculoskeletal:     Comments: Moves all extremities, no edema  Skin:    General: Skin is warm and dry.  Neurological:     General: No focal deficit present.     Mental Status: She is alert and oriented to person, place, and time.  Psychiatric:        Mood and Affect: Mood normal.        Behavior: Behavior normal.     Results: HIDA 11/2018 EXAM: NUCLEAR MEDICINE HEPATOBILIARY IMAGING WITH GALLBLADDER EF  VIEWS: Anterior right upper quadrant  RADIOPHARMACEUTICALS: 5.0 mCi Tc-46mmebrofenin IV  COMPARISON: None.  FINDINGS: Liver uptake of radiotracer is unremarkable. There is prompt visualization of gallbladder and small bowel, indicating patency of the cystic and common bile ducts. Patient consumed 8 ounces of Ensure Plus  orally with calculation of the computer generated ejection fraction of radiotracer from the gallbladder. The patient did not have clinical symptoms with the Ensure consumption. The computer generated ejection fraction of radiotracer from the gallbladder is abnormally low at 26%, normal greater than 33% using the oral agent.  IMPRESSION: Abnormally low ejection fraction of radiotracer from the gallbladder, a finding indicative of biliary dyskinesia. Cystic and common bile ducts are patent as is evidenced by visualization of gallbladder and small bowel.   OSH Labs-WBC 11, H&H 12.9/39.2, Platelets 415; Cr 0.88, T bil 0.3, Alk phos 204H, ALT 25, AST 25, Albumin 4  Assessment & Plan:  Kathleen Mckeonis a 43y.o. female with biliary dyskinesia on the HIDA scan. I am unsure if she has any gallstones, but her labs do not show any elevated T bilirubin. I do not think we need to pursue any additional testing.  -PLAN: I counseled the patient about the indication, risks and benefits of laparoscopic cholecystectomy.  She understands there is a very small chance for bleeding, infection, injury to normal structures (including common bile duct), conversion to open surgery, persistent symptoms, evolution of postcholecystectomy diarrhea, need for secondary interventions, anesthesia reaction, cardiopulmonary issues and other risks not specifically detailed here. I described the expected recovery, the plan for follow-up and the restrictions during the recovery phase.  All questions were answered.   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    LVirl Cagey10/29/2020, 1:50 PM

## 2018-12-07 ENCOUNTER — Encounter (HOSPITAL_COMMUNITY)
Admission: RE | Admit: 2018-12-07 | Discharge: 2018-12-07 | Disposition: A | Discharge status: Home / Self Care | Payer: Commercial Managed Care - PPO | Source: Ambulatory Visit | Attending: General Surgery | Admitting: General Surgery

## 2018-12-07 ENCOUNTER — Other Ambulatory Visit (HOSPITAL_COMMUNITY)
Admission: RE | Admit: 2018-12-07 | Discharge: 2018-12-07 | Disposition: A | Discharge status: Home / Self Care | Payer: Commercial Managed Care - PPO | Source: Ambulatory Visit | Attending: General Surgery | Admitting: General Surgery

## 2018-12-07 ENCOUNTER — Encounter (HOSPITAL_COMMUNITY): Payer: Self-pay

## 2018-12-07 ENCOUNTER — Other Ambulatory Visit: Payer: Self-pay

## 2018-12-07 DIAGNOSIS — Z20828 Contact with and (suspected) exposure to other viral communicable diseases: Secondary | ICD-10-CM | POA: Diagnosis not present

## 2018-12-07 DIAGNOSIS — Z01812 Encounter for preprocedural laboratory examination: Secondary | ICD-10-CM | POA: Insufficient documentation

## 2018-12-07 HISTORY — DX: Gastro-esophageal reflux disease without esophagitis: K21.9

## 2018-12-07 LAB — COMPREHENSIVE METABOLIC PANEL
ALT: 30 U/L (ref 0–44)
AST: 27 U/L (ref 15–41)
Albumin: 3.8 g/dL (ref 3.5–5.0)
Alkaline Phosphatase: 175 U/L — ABNORMAL HIGH (ref 38–126)
Anion gap: 9 (ref 5–15)
BUN: 11 mg/dL (ref 6–20)
CO2: 25 mmol/L (ref 22–32)
Calcium: 9.1 mg/dL (ref 8.9–10.3)
Chloride: 103 mmol/L (ref 98–111)
Creatinine, Ser: 0.86 mg/dL (ref 0.44–1.00)
GFR calc Af Amer: >60 mL/min (ref >60)
GFR calc non Af Amer: >60 mL/min (ref >60)
Glucose, Bld: 84 mg/dL (ref 70–99)
Potassium: 4.2 mmol/L (ref 3.5–5.1)
Sodium: 137 mmol/L (ref 135–145)
Total Bilirubin: 0.6 mg/dL (ref 0.3–1.2)
Total Protein: 7.7 g/dL (ref 6.5–8.1)

## 2018-12-07 LAB — CBC WITH DIFFERENTIAL/PLATELET
Abs Immature Granulocytes: 0.03 10*3/uL (ref 0.00–0.07)
Basophils Absolute: 0.1 10*3/uL (ref 0.0–0.1)
Basophils Relative: 1 %
Eosinophils Absolute: 0.6 10*3/uL — ABNORMAL HIGH (ref 0.0–0.5)
Eosinophils Relative: 6 %
HCT: 44.2 % (ref 36.0–46.0)
Hemoglobin: 13.8 g/dL (ref 12.0–15.0)
Immature Granulocytes: 0 %
Lymphocytes Relative: 30 %
Lymphs Abs: 3 10*3/uL (ref 0.7–4.0)
MCH: 26.3 pg (ref 26.0–34.0)
MCHC: 31.2 g/dL (ref 30.0–36.0)
MCV: 84.4 fL (ref 80.0–100.0)
Monocytes Absolute: 0.4 10*3/uL (ref 0.1–1.0)
Monocytes Relative: 4 %
Neutro Abs: 5.8 10*3/uL (ref 1.7–7.7)
Neutrophils Relative %: 59 %
Platelets: 434 10*3/uL — ABNORMAL HIGH (ref 150–400)
RBC: 5.24 MIL/uL — ABNORMAL HIGH (ref 3.87–5.11)
RDW: 14.4 % (ref 11.5–15.5)
WBC: 9.9 10*3/uL (ref 4.0–10.5)
nRBC: 0 % (ref 0.0–0.2)

## 2018-12-07 LAB — SARS CORONAVIRUS 2 (TAT 6-24 HRS): SARS Coronavirus 2: NEGATIVE

## 2018-12-07 LAB — HCG, SERUM, QUALITATIVE: Preg, Serum: NEGATIVE

## 2018-12-07 LAB — HEMOGLOBIN A1C
Hgb A1c MFr Bld: 5.8 % — ABNORMAL HIGH (ref 4.8–5.6)
Mean Plasma Glucose: 119.76 mg/dL

## 2018-12-08 ENCOUNTER — Encounter (HOSPITAL_COMMUNITY): Payer: Self-pay | Admitting: *Deleted

## 2018-12-08 ENCOUNTER — Ambulatory Visit (HOSPITAL_COMMUNITY)
Admission: RE | Admit: 2018-12-08 | Discharge: 2018-12-08 | Disposition: A | Discharge status: Home / Self Care | Payer: Commercial Managed Care - PPO | Attending: General Surgery | Admitting: General Surgery

## 2018-12-08 ENCOUNTER — Encounter (HOSPITAL_COMMUNITY)
Admission: RE | Disposition: A | Discharge status: Home / Self Care | Payer: Self-pay | Source: Home / Self Care | Attending: General Surgery

## 2018-12-08 ENCOUNTER — Ambulatory Visit (HOSPITAL_COMMUNITY): Payer: Commercial Managed Care - PPO | Admitting: Anesthesiology

## 2018-12-08 DIAGNOSIS — Z79899 Other long term (current) drug therapy: Secondary | ICD-10-CM | POA: Insufficient documentation

## 2018-12-08 DIAGNOSIS — K8010 Calculus of gallbladder with chronic cholecystitis without obstruction: Secondary | ICD-10-CM | POA: Insufficient documentation

## 2018-12-08 DIAGNOSIS — K828 Other specified diseases of gallbladder: Secondary | ICD-10-CM

## 2018-12-08 DIAGNOSIS — R7303 Prediabetes: Secondary | ICD-10-CM | POA: Diagnosis not present

## 2018-12-08 DIAGNOSIS — J45909 Unspecified asthma, uncomplicated: Secondary | ICD-10-CM | POA: Insufficient documentation

## 2018-12-08 DIAGNOSIS — K219 Gastro-esophageal reflux disease without esophagitis: Secondary | ICD-10-CM | POA: Diagnosis not present

## 2018-12-08 DIAGNOSIS — Z6841 Body Mass Index (BMI) 40.0 and over, adult: Secondary | ICD-10-CM | POA: Insufficient documentation

## 2018-12-08 DIAGNOSIS — R109 Unspecified abdominal pain: Secondary | ICD-10-CM | POA: Diagnosis present

## 2018-12-08 HISTORY — PX: CHOLECYSTECTOMY: SHX55

## 2018-12-08 SURGERY — LAPAROSCOPIC CHOLECYSTECTOMY
Anesthesia: General | Site: Abdomen

## 2018-12-08 MED ORDER — ONDANSETRON HCL 4 MG/2ML IJ SOLN
INTRAMUSCULAR | Status: AC
  Filled 2018-12-08: qty 2

## 2018-12-08 MED ORDER — HEMOSTATIC AGENTS (NO CHARGE) OPTIME
TOPICAL | Status: DC | PRN
  Administered 2018-12-08: 1 via TOPICAL

## 2018-12-08 MED ORDER — MIDAZOLAM HCL 2 MG/2ML IJ SOLN: 0.5000 mg | Freq: Once | INTRAMUSCULAR | Status: DC | PRN

## 2018-12-08 MED ORDER — LACTATED RINGERS IV SOLN
INTRAVENOUS | Status: DC
  Administered 2018-12-08: 09:00:00 1000 mL via INTRAVENOUS

## 2018-12-08 MED ORDER — SODIUM CHLORIDE 0.9 % IR SOLN
Status: DC | PRN
  Administered 2018-12-08: 1000 mL

## 2018-12-08 MED ORDER — LIDOCAINE 2% (20 MG/ML) 5 ML SYRINGE
INTRAMUSCULAR | Status: AC
  Filled 2018-12-08: qty 10

## 2018-12-08 MED ORDER — OXYCODONE HCL 5 MG PO TABS: 5.0000 mg | ORAL_TABLET | ORAL | 0 refills | Status: DC | PRN

## 2018-12-08 MED ORDER — EPHEDRINE 5 MG/ML INJ
INTRAVENOUS | Status: AC
  Filled 2018-12-08: qty 10

## 2018-12-08 MED ORDER — ONDANSETRON HCL 4 MG/2ML IJ SOLN
INTRAMUSCULAR | Status: DC | PRN
  Administered 2018-12-08: 4 mg via INTRAVENOUS

## 2018-12-08 MED ORDER — CHLORHEXIDINE GLUCONATE CLOTH 2 % EX PADS: 6.0000 | MEDICATED_PAD | Freq: Once | CUTANEOUS | Status: DC

## 2018-12-08 MED ORDER — ARTIFICIAL TEARS OPHTHALMIC OINT
TOPICAL_OINTMENT | OPHTHALMIC | Status: AC
  Filled 2018-12-08: qty 3.5

## 2018-12-08 MED ORDER — LIDOCAINE HCL (CARDIAC) PF 100 MG/5ML IV SOSY
PREFILLED_SYRINGE | INTRAVENOUS | Status: DC | PRN
  Administered 2018-12-08: 60 mg via INTRAVENOUS

## 2018-12-08 MED ORDER — BUPIVACAINE HCL (PF) 0.5 % IJ SOLN
INTRAMUSCULAR | Status: AC
  Filled 2018-12-08: qty 30

## 2018-12-08 MED ORDER — DEXAMETHASONE SODIUM PHOSPHATE 4 MG/ML IJ SOLN
INTRAMUSCULAR | Status: DC | PRN
  Administered 2018-12-08: 4 mg via INTRAVENOUS

## 2018-12-08 MED ORDER — SEVOFLURANE IN SOLN
RESPIRATORY_TRACT | Status: AC
  Filled 2018-12-08: qty 250

## 2018-12-08 MED ORDER — ONDANSETRON HCL 4 MG/2ML IJ SOLN
4.0000 mg | Freq: Once | INTRAMUSCULAR | Status: AC
  Administered 2018-12-08: 11:00:00 4 mg via INTRAVENOUS

## 2018-12-08 MED ORDER — PROPOFOL 10 MG/ML IV BOLUS
INTRAVENOUS | Status: AC
  Filled 2018-12-08: qty 20

## 2018-12-08 MED ORDER — MIDAZOLAM HCL 2 MG/2ML IJ SOLN
INTRAMUSCULAR | Status: AC
  Filled 2018-12-08: qty 2

## 2018-12-08 MED ORDER — CIPROFLOXACIN IN D5W 400 MG/200ML IV SOLN
INTRAVENOUS | Status: AC
  Filled 2018-12-08: qty 200

## 2018-12-08 MED ORDER — HYDROMORPHONE HCL 1 MG/ML IJ SOLN
0.2500 mg | INTRAMUSCULAR | Status: DC | PRN
  Administered 2018-12-08: 11:00:00 0.5 mg via INTRAVENOUS
  Filled 2018-12-08: qty 0.5

## 2018-12-08 MED ORDER — CIPROFLOXACIN IN D5W 400 MG/200ML IV SOLN
400.0000 mg | INTRAVENOUS | Status: AC
  Administered 2018-12-08: 400 mg via INTRAVENOUS

## 2018-12-08 MED ORDER — DEXAMETHASONE SODIUM PHOSPHATE 10 MG/ML IJ SOLN
INTRAMUSCULAR | Status: AC
  Filled 2018-12-08: qty 1

## 2018-12-08 MED ORDER — ROCURONIUM BROMIDE 100 MG/10ML IV SOLN
INTRAVENOUS | Status: DC | PRN
  Administered 2018-12-08: 50 mg via INTRAVENOUS

## 2018-12-08 MED ORDER — DOCUSATE SODIUM 100 MG PO CAPS: 100.0000 mg | ORAL_CAPSULE | Freq: Two times a day (BID) | ORAL | 2 refills | Status: DC

## 2018-12-08 MED ORDER — SUCCINYLCHOLINE CHLORIDE 200 MG/10ML IV SOSY
PREFILLED_SYRINGE | INTRAVENOUS | Status: AC
  Filled 2018-12-08: qty 30

## 2018-12-08 MED ORDER — FENTANYL CITRATE (PF) 250 MCG/5ML IJ SOLN
INTRAMUSCULAR | Status: AC
  Filled 2018-12-08: qty 5

## 2018-12-08 MED ORDER — FENTANYL CITRATE (PF) 100 MCG/2ML IJ SOLN
INTRAMUSCULAR | Status: DC | PRN
  Administered 2018-12-08: 100 ug via INTRAVENOUS
  Administered 2018-12-08: 150 ug via INTRAVENOUS

## 2018-12-08 MED ORDER — PHENYLEPHRINE 40 MCG/ML (10ML) SYRINGE FOR IV PUSH (FOR BLOOD PRESSURE SUPPORT)
PREFILLED_SYRINGE | INTRAVENOUS | Status: AC
  Filled 2018-12-08: qty 10

## 2018-12-08 MED ORDER — HYDROCODONE-ACETAMINOPHEN 7.5-325 MG PO TABS
1.0000 | ORAL_TABLET | Freq: Once | ORAL | Status: AC | PRN
  Administered 2018-12-08: 11:00:00 1 via ORAL
  Filled 2018-12-08: qty 1

## 2018-12-08 MED ORDER — ROCURONIUM BROMIDE 10 MG/ML (PF) SYRINGE
PREFILLED_SYRINGE | INTRAVENOUS | Status: AC
  Filled 2018-12-08: qty 10

## 2018-12-08 MED ORDER — PROMETHAZINE HCL 25 MG/ML IJ SOLN: 6.2500 mg | INTRAMUSCULAR | Status: DC | PRN

## 2018-12-08 MED ORDER — MIDAZOLAM HCL 5 MG/5ML IJ SOLN
INTRAMUSCULAR | Status: DC | PRN
  Administered 2018-12-08: 2 mg via INTRAVENOUS

## 2018-12-08 MED ORDER — PROPOFOL 10 MG/ML IV BOLUS
INTRAVENOUS | Status: DC | PRN
  Administered 2018-12-08: 200 mg via INTRAVENOUS

## 2018-12-08 MED ORDER — GLYCOPYRROLATE PF 0.2 MG/ML IJ SOSY
PREFILLED_SYRINGE | INTRAMUSCULAR | Status: DC | PRN
  Administered 2018-12-08: .2 mg via INTRAVENOUS

## 2018-12-08 MED ORDER — SUGAMMADEX SODIUM 200 MG/2ML IV SOLN
INTRAVENOUS | Status: DC | PRN
  Administered 2018-12-08: 200 mg via INTRAVENOUS

## 2018-12-08 MED ORDER — BUPIVACAINE HCL (PF) 0.5 % IJ SOLN
INTRAMUSCULAR | Status: DC | PRN
  Administered 2018-12-08: 10 mL

## 2018-12-08 SURGICAL SUPPLY — 54 items
ADH SKN CLS APL DERMABOND .7 (GAUZE/BANDAGES/DRESSINGS) ×1
APL PRP STRL LF DISP 70% ISPRP (MISCELLANEOUS) ×1
APL SWBSTK 6 STRL LF DISP (MISCELLANEOUS) ×1
APPLICATOR COTTON TIP 6 STRL (MISCELLANEOUS) IMPLANT
APPLICATOR COTTON TIP 6IN STRL (MISCELLANEOUS) ×3
APPLIER CLIP ROT 10 11.4 M/L (STAPLE) ×3
APR CLP MED LRG 11.4X10 (STAPLE) ×1
BAG RETRIEVAL 10 (BASKET) ×1
BAG RETRIEVAL 10MM (BASKET) ×1
BLADE SURG 15 STRL LF DISP TIS (BLADE) ×1 IMPLANT
BLADE SURG 15 STRL SS (BLADE) ×3
CHLORAPREP W/TINT 26 (MISCELLANEOUS) ×3 IMPLANT
CLIP APPLIE ROT 10 11.4 M/L (STAPLE) ×1 IMPLANT
CLOTH BEACON ORANGE TIMEOUT ST (SAFETY) ×3 IMPLANT
COVER LIGHT HANDLE STERIS (MISCELLANEOUS) ×6 IMPLANT
COVER WAND RF STERILE (DRAPES) ×3 IMPLANT
DECANTER SPIKE VIAL GLASS SM (MISCELLANEOUS) ×3 IMPLANT
DERMABOND ADVANCED (GAUZE/BANDAGES/DRESSINGS) ×2
DERMABOND ADVANCED .7 DNX12 (GAUZE/BANDAGES/DRESSINGS) ×1 IMPLANT
ELECT REM PT RETURN 9FT ADLT (ELECTROSURGICAL) ×3
ELECTRODE REM PT RTRN 9FT ADLT (ELECTROSURGICAL) ×1 IMPLANT
FILTER SMOKE EVAC LAPAROSHD (FILTER) ×3 IMPLANT
GLOVE BIO SURGEON STRL SZ 6.5 (GLOVE) ×2 IMPLANT
GLOVE BIO SURGEON STRL SZ7 (GLOVE) ×2 IMPLANT
GLOVE BIO SURGEONS STRL SZ 6.5 (GLOVE) ×1
GLOVE BIOGEL PI IND STRL 6.5 (GLOVE) ×1 IMPLANT
GLOVE BIOGEL PI IND STRL 7.0 (GLOVE) ×3 IMPLANT
GLOVE BIOGEL PI INDICATOR 6.5 (GLOVE) ×2
GLOVE BIOGEL PI INDICATOR 7.0 (GLOVE) ×8
GLOVE ECLIPSE 6.5 STRL STRAW (GLOVE) ×2 IMPLANT
GLOVE SURG SS PI 7.5 STRL IVOR (GLOVE) ×2 IMPLANT
GOWN STRL REUS W/TWL LRG LVL3 (GOWN DISPOSABLE) ×9 IMPLANT
HEMOSTAT SNOW SURGICEL 2X4 (HEMOSTASIS) ×3 IMPLANT
INST SET LAPROSCOPIC AP (KITS) ×3 IMPLANT
KIT TURNOVER KIT A (KITS) ×3 IMPLANT
MANIFOLD NEPTUNE II (INSTRUMENTS) ×3 IMPLANT
NDL INSUFFLATION 14GA 120MM (NEEDLE) ×1 IMPLANT
NEEDLE INSUFFLATION 14GA 120MM (NEEDLE) ×3 IMPLANT
NS IRRIG 1000ML POUR BTL (IV SOLUTION) ×3 IMPLANT
PACK LAP CHOLE LZT030E (CUSTOM PROCEDURE TRAY) ×3 IMPLANT
PAD ARMBOARD 7.5X6 YLW CONV (MISCELLANEOUS) ×3 IMPLANT
SET BASIN LINEN APH (SET/KITS/TRAYS/PACK) ×3 IMPLANT
SET TUBE SMOKE EVAC HIGH FLOW (TUBING) ×3 IMPLANT
SLEEVE ENDOPATH XCEL 5M (ENDOMECHANICALS) ×3 IMPLANT
SUT MNCRL AB 4-0 PS2 18 (SUTURE) ×5 IMPLANT
SUT VICRYL 0 UR6 27IN ABS (SUTURE) ×3 IMPLANT
SYS BAG RETRIEVAL 10MM (BASKET) ×1
SYSTEM BAG RETRIEVAL 10MM (BASKET) ×1 IMPLANT
TROCAR ENDO BLADELESS 11MM (ENDOMECHANICALS) ×3 IMPLANT
TROCAR XCEL NON-BLD 5MMX100MML (ENDOMECHANICALS) ×3 IMPLANT
TROCAR XCEL UNIV SLVE 11M 100M (ENDOMECHANICALS) ×3 IMPLANT
TUBE CONNECTING 12'X1/4 (SUCTIONS) ×1
TUBE CONNECTING 12X1/4 (SUCTIONS) ×2 IMPLANT
WARMER LAPAROSCOPE (MISCELLANEOUS) ×3 IMPLANT

## 2018-12-08 NOTE — Op Note (Signed)
Operative Note   Preoperative Diagnosis: Biliary dyskinesia    Postoperative Diagnosis: Same   Procedure(s) Performed: Laparoscopic cholecystectomy   Surgeon: Ria Comment C. Constance Haw, MD   Assistants: Aviva Signs, MD    Anesthesia: General endotracheal   Anesthesiologist: Lenice Llamas, MD    Specimens: Gallbladder    Estimated Blood Loss: Minimal    Blood Replacement: None    Complications: None    Operative Findings: Distended gallbladder    Procedure: The patient was taken to the operating room and placed supine. General endotracheal anesthesia was induced. Intravenous antibiotics were administered per protocol. An orogastric tube positioned to decompress the stomach. The abdomen was prepared and draped in the usual sterile fashion.    A supraumbilical  incision was made and a Veress technique was utilized to achieve pneumoperitoneum to 15 mmHg with carbon dioxide. A 11 mm optiview port was placed through the supraumbilical region, and a 10 mm 0-degree operative laparoscope was introduced. The area underlying the trocar and Veress needle were inspected and without evidence of injury.  Remaining trocars were placed under direct vision. Two 5 mm ports were placed in the right abdomen, between the anterior axillary and midclavicular line.  A final 11 mm port was placed through the mid-epigastrium, near the falciform ligament.    The gallbladder fundus was elevated cephalad and the infundibulum was retracted to the patient's right. The gallbladder/cystic duct junction was skeletonized. The cystic artery noted in the triangle of Calot and was also skeletonized.  We then continued liberal medial and lateral dissection until the critical view of safety was achieved.    The cystic duct and cystic artery were doubly clipped and divided. The gallbladder was then dissected from the liver bed with electrocautery. The specimen was placed in an Endopouch and was retrieved through the epigastric  site.   Final inspection revealed acceptable hemostasis. Surgical Emogene Morgan was placed in the gallbladder bed.  Trocars were removed and pneumoperitoneum was released. The epigastric and umbilical sites were smaller than the tip of my finger. Skin incisions were closed with 4-0 Monocryl subcuticular sutures and Dermabond. The patient was awakened from anesthesia and extubated without complication.    Curlene Labrum, MD [facility] [address] [place], [place] [address] [phone] (office)

## 2018-12-08 NOTE — Interval H&P Note (Signed)
History and Physical Interval Note:  12/08/2018 8:59 AM  Kathleen Stokes  has presented today for surgery, with the diagnosis of Biliary Dyskenesia.  The various methods of treatment have been discussed with the patient and family. After consideration of risks, benefits and other options for treatment, the patient has consented to  Procedure(s): LAPAROSCOPIC CHOLECYSTECTOMY (N/A) as a surgical intervention.  The patient's history has been reviewed, patient examined, no change in status, stable for surgery.  I have reviewed the patient's chart and labs.  Questions were answered to the patient's satisfaction.    No questions or changes.   Virl Cagey

## 2018-12-08 NOTE — Progress Notes (Signed)
Pt was nauseated when received in Post-op. Did not complain of nausea in PACU. Given Zofran 4mg  IV at 11:05p. She ate 1 cracker and drank 1/2 cup of coke. Nausea was a little better when she left at 11:28p.

## 2018-12-08 NOTE — Anesthesia Postprocedure Evaluation (Signed)
Anesthesia Post Note  Patient: Kathleen Stokes  Procedure(s) Performed: LAPAROSCOPIC CHOLECYSTECTOMY (N/A Abdomen)  Patient location during evaluation: PACU Anesthesia Type: General Level of consciousness: awake and alert, oriented, awake and patient cooperative Pain management: pain level controlled Vital Signs Assessment: post-procedure vital signs reviewed and stable Respiratory status: spontaneous breathing, respiratory function stable, nonlabored ventilation and patient connected to face mask oxygen Cardiovascular status: stable Postop Assessment: no apparent nausea or vomiting Anesthetic complications: no     Last Vitals:  Vitals:   12/08/18 0821  BP: 119/68  Resp: 18  Temp: 36.9 C  SpO2: 97%    Last Pain:  Vitals:   12/08/18 0821  TempSrc: Oral  PainSc: 0-No pain                 Lavonna Lampron

## 2018-12-08 NOTE — Discharge Instructions (Signed)
Discharge Laparoscopic Surgery Instructions: ° °Common Complaints: °Right shoulder pain is common after laparoscopic surgery. This is secondary to the gas used in the surgery being trapped under the diaphragm.  °Walk to help your body absorb the gas. This will improve in a few days. °Pain at the port sites are common, especially the larger port sites. This will improve with time.  °Some nausea is common and poor appetite. The main goal is to stay hydrated the first few days after surgery.  ° °Diet/ Activity: °Diet as tolerated. You may not have an appetite, but it is important to stay hydrated. Drink 64 ounces of water a day. Your appetite will return with time.  °Shower per your regular routine daily.  Do not take hot showers. Take warm showers that are less than 10 minutes. °Rest and listen to your body, but do not remain in bed all day.  °Walk everyday for at least 15-20 minutes. Deep cough and move around every 1-2 hours in the first few days after surgery.  °Do not lift > 10 lbs, perform excessive bending, pushing, pulling, squatting for 1-2 weeks after surgery.  °Do not pick at the dermabond glue on your incision sites.  This glue film will remain in place for 1-2 weeks and will start to peel off.  °Do not place lotions or balms on your incision unless instructed to specifically by Dr. Bridges.  ° °Medication: °Take tylenol and ibuprofen as needed for pain control, alternating every 4-6 hours.  °Example:  °Tylenol 1000mg @ 6am, 12noon, 6pm, 12midnight (Do not exceed 4000mg of tylenol a day). Ibuprofen 800mg @ 9am, 3pm, 9pm, 3am (Do not exceed 3600mg of ibuprofen a day).  °Take Roxicodone for breakthrough pain every 4 hours.  °Take Colace for constipation related to narcotic pain medication. If you do not have a bowel movement in 2 days, take Miralax over the counter.  °Drink plenty of water to also prevent constipation.  ° °Contact Information: °If you have questions or concerns, please call our office,  [phone], Monday- Thursday 8AM-5PM and Friday 8AM-12Noon.  °If it is after hours or on the weekend, please call [facility] Main [facility], [phone], and ask to speak to the surgeon on call for Dr. Bridges at [facility].  ° ° °Laparoscopic Cholecystectomy, Care After °This sheet gives you information about how to care for yourself after your procedure. Your doctor may also give you more specific instructions. If you have problems or questions, contact your doctor. °Follow these instructions at home: °Care for cuts from surgery (incisions) ° °· Follow instructions from your doctor about how to take care of your cuts from surgery. Make sure you: °? Wash your hands with soap and water before you change your bandage (dressing). If you cannot use soap and water, use hand sanitizer. °? Change your bandage as told by your doctor. °? Leave stitches (sutures), skin glue, or skin tape (adhesive) strips in place. They may need to stay in place for 2 weeks or longer. If tape strips get loose and curl up, you may trim the loose edges. Do not remove tape strips completely unless your doctor says it is okay. °· Do not take baths, swim, or use a hot tub until your doctor says it is okay.  °· You may shower. °· Check your surgical cut area every day for signs of infection. Check for: °? More redness, swelling, or pain. °? More fluid or blood. °? Warmth. °? Pus or a bad smell. °Activity °· Do   not drive or use heavy machinery while taking prescription pain medicine. °· Do not lift anything that is heavier than 10 lb (4.5 kg) until your doctor says it is okay. °· Do not play contact sports until your doctor says it is okay. °· Do not drive for 24 hours if you were given a medicine to help you relax (sedative). °· Rest as needed. Do not return to work or school until your doctor says it is okay. °General instructions °· Take over-the-counter and prescription medicines only as told by your doctor. °· To prevent or treat constipation  while you are taking prescription pain medicine, your doctor may recommend that you: °? Drink enough fluid to keep your pee (urine) clear or pale yellow. °? Take over-the-counter or prescription medicines. °? Eat foods that are high in fiber, such as fresh fruits and vegetables, whole grains, and beans. °? Limit foods that are high in fat and processed sugars, such as fried and sweet foods. °Contact a doctor if: °· You develop a rash. °· You have more redness, swelling, or pain around your surgical cuts. °· You have more fluid or blood coming from your surgical cuts. °· Your surgical cuts feel warm to the touch. °· You have pus or a bad smell coming from your surgical cuts. °· You have a fever. °· One or more of your surgical cuts breaks open. °Get help right away if: °· You have trouble breathing. °· You have chest pain. °· You have pain that is getting worse in your shoulders. °· You faint or feel dizzy when you stand. °· You have very bad pain in your belly (abdomen). °· You are sick to your stomach (nauseous) for more than one day. °· You have throwing up (vomiting) that lasts for more than one day. °· You have leg pain. °This information is not intended to replace advice given to you by your health care provider. Make sure you discuss any questions you have with your health care provider. °Document Released: 10/30/2007 Document Revised: 01/02/2017 Document Reviewed: 07/09/2015 °Elsevier Patient Education © 2020 [employer] ° °

## 2018-12-08 NOTE — Transfer of Care (Signed)
Immediate Anesthesia Transfer of Care Note  Patient: Kathleen Stokes  Procedure(s) Performed: LAPAROSCOPIC CHOLECYSTECTOMY (N/A Abdomen)  Patient Location: PACU  Anesthesia Type:General  Level of Consciousness: awake, alert , oriented and patient cooperative  Airway & Oxygen Therapy: Patient Spontanous Breathing and Patient connected to face mask oxygen  Post-op Assessment: Report given to RN, Post -op Vital signs reviewed and stable and Patient moving all extremities X 4  Post vital signs: Reviewed and stable  Last Vitals:  Vitals Value Taken Time  BP    Temp    Pulse 90 12/08/18 0958  Resp    SpO2 88 % 12/08/18 0958  Vitals shown include unvalidated device data.  Last Pain:  Vitals:   12/08/18 0821  TempSrc: Oral  PainSc: 0-No pain      Patients Stated Pain Goal: 4 ([id number])  Complications: No apparent anesthesia complications

## 2018-12-08 NOTE — Progress Notes (Signed)
[facility]  Notified son, Audry Pili surgery was completed. Rx sent to [facility].   Curlene Labrum, MD [facility] [address] [place], [place] [address] [phone] (office)

## 2018-12-08 NOTE — Anesthesia Preprocedure Evaluation (Signed)
Anesthesia Evaluation  Patient identified by MRN, date of birth, ID band Patient awake    Reviewed: Allergy & Precautions, NPO status , Patient's Chart, lab work & pertinent test results  Airway Mallampati: II  TM Distance: >3 FB Neck ROM: Full    Dental no notable dental hx. (+) Teeth Intact   Pulmonary asthma ,    Pulmonary exam normal breath sounds clear to auscultation       Cardiovascular Exercise Tolerance: Good negative cardio ROS Normal cardiovascular examI Rhythm:Regular Rate:Normal     Neuro/Psych negative neurological ROS  negative psych ROS   GI/Hepatic Neg liver ROS, GERD  Medicated and Controlled,  Endo/Other  Morbid obesity  Renal/GU negative Renal ROS  negative genitourinary   Musculoskeletal negative musculoskeletal ROS (+)   Abdominal   Peds negative pediatric ROS (+)  Hematology negative hematology ROS (+)   Anesthesia Other Findings   Reproductive/Obstetrics negative OB ROS                             Anesthesia Physical Anesthesia Plan  ASA: III  Anesthesia Plan: General   Post-op Pain Management:    Induction: Intravenous  PONV Risk Score and Plan: 3 and Midazolam, Ondansetron, Dexamethasone and Treatment may vary due to age or medical condition  Airway Management Planned: Oral ETT  Additional Equipment:   Intra-op Plan:   Post-operative Plan: Extubation in OR  Informed Consent: I have reviewed the patients History and Physical, chart, labs and discussed the procedure including the risks, benefits and alternatives for the proposed anesthesia with the patient or authorized representative who has indicated his/her understanding and acceptance.     Dental advisory given  Plan Discussed with: CRNA  Anesthesia Plan Comments: (Plan Full PPE use Plan GETA D/W PT -WTP with same after Q&A)        Anesthesia Quick Evaluation

## 2018-12-08 NOTE — Anesthesia Procedure Notes (Signed)
Procedure Name: Intubation Date/Time: 12/08/2018 9:12 AM Performed by: Andree Elk, Amy A, CRNA Pre-anesthesia Checklist: Patient identified, Emergency Drugs available, Suction available, Patient being monitored and Timeout performed Patient Re-evaluated:Patient Re-evaluated prior to induction Oxygen Delivery Method: Circle system utilized Preoxygenation: Pre-oxygenation with 100% oxygen Induction Type: IV induction and Rapid sequence Ventilation: Mask ventilation without difficulty Laryngoscope Size: Mac and 3 Grade View: Grade II Tube type: Oral Tube size: 7.0 mm Number of attempts: 1 Airway Equipment and Method: Stylet Placement Confirmation: ETT inserted through vocal cords under direct vision,  positive ETCO2 and breath sounds checked- equal and bilateral Secured at: 22 cm Tube secured with: Tape Dental Injury: Teeth and Oropharynx as per pre-operative assessment

## 2018-12-09 ENCOUNTER — Encounter (HOSPITAL_COMMUNITY): Payer: Self-pay | Admitting: General Surgery

## 2018-12-09 LAB — SURGICAL PATHOLOGY

## 2018-12-23 ENCOUNTER — Other Ambulatory Visit: Payer: Self-pay

## 2018-12-23 ENCOUNTER — Telehealth: Payer: Self-pay | Admitting: General Surgery

## 2018-12-24 NOTE — Progress Notes (Signed)
[facility]  I am calling the patient for post operative evaluation due to the current COVID 19 pandemic.  The patient had a laparoscopic cholecystectomy on 11/4. Patient asked for [place] to call her 11/20 due to work.  She did not answer but a I left a message. Notified that pathology was good. Told her to call the office if she had any questions or concerns.  Will see the patient PRN.   Pathology: FINAL MICROSCOPIC DIAGNOSIS:   A. GALLBLADDER, CHOLECYSTECOMY:  - Chronic cholecystitis and cholelithiasis.  - There is no evidence of malignancy.    Curlene Labrum, MD [facility] [address] [place], [place] [address] [phone] (office)

## 2019-05-03 NOTE — Progress Notes (Addendum)
Cardiology Office Note  Date: 05/04/2019   ID: Kathleen Stokes, DOB 1975-02-19, MRN [id number]  PCP:  Sharilyn Sites, MD  Cardiologist:  Kate Sable, MD Electrophysiologist:  None   Chief Complaint: Follow-up palpitations  History of Present Illness: Kathleen Stokes is a 44 y.o. female with a history of palpitations, history of asthma, shortness of breath.  Patient last saw Dr. Bronson Ing May 11, 2018 via telemedicine visit.  She was symptomatically stable and having no palpitations.  Event monitor was reviewed showing sinus tachycardia with frequent PACs.  Plans were to start metoprolol twice daily if she had a recurrence of palpitations.  Her metered-dose albuterol inhaler was refilled for complaints of shortness of breath.  In the interim since last visit patient had a cholecystectomy for chronic cholecystitis and cholelithiasis, with evidence of low ejection fraction/biliary dyskinesia on HIDA scan by Dr. Blake Divine [facility].  She had been having episodes of epigastric and right upper quadrant pain associated with nausea and vomiting.  Patient states she had been doing well since last visit until approximately 3 weeks ago when she began having persistent palpitations which she describes as happening all day long.  She denies any other symptoms such as presyncope or syncope, orthostatic symptoms, angina or exertional symptoms.  States she switched to diet drinks but stopped due to belief that aspartame may be contributing to arrhythmias.  She states they have been persistent over the last 3 weeks without ceasing.  He states the only thing that helps her is when she sleeps.  She asked me to leave a note with Dr. Bronson Ing reminding him she is the daughter of Kathleen Stokes who is her patient who has atrial fibrillation.  Past Medical History:  Diagnosis Date  . Asthma   . GERD (gastroesophageal reflux disease)   . Palpitations   . Pre-diabetes   .  Prediabetes     Past Surgical History:  Procedure Laterality Date  . CATARACT EXTRACTION Left   . CHOLECYSTECTOMY N/A 12/08/2018   Procedure: LAPAROSCOPIC CHOLECYSTECTOMY;  Surgeon: Virl Cagey, MD;  Location: [facility];  Service: General;  Laterality: N/A;    Current Outpatient Medications  Medication Sig Dispense Refill  . albuterol (PROVENTIL HFA;VENTOLIN HFA) 108 (90 Base) MCG/ACT inhaler Inhale 2 puffs into the lungs every 6 (six) hours as needed for wheezing or shortness of breath. 1 Inhaler 0  . esomeprazole (NEXIUM) 20 MG capsule Take 40 mg by mouth daily.     . fluticasone (FLONASE) 50 MCG/ACT nasal spray Place 1 spray into both nostrils daily.     Marland Kitchen liraglutide (VICTOZA) 18 MG/3ML SOPN Inject 1.8 mg into the skin at bedtime.     . metoprolol succinate (TOPROL XL) 25 MG 24 hr tablet Take 0.5 tablets (12.5 mg total) by mouth daily. For 2 weeks, then increase to 25 mg as directed 30 tablet 1   No current facility-administered medications for this visit.   Allergies:  Penicillins   Social History: The patient  reports that she has never smoked. She has never used smokeless tobacco. She reports previous alcohol use. She reports that she does not use drugs.   Family History: The patient's family history includes Gallbladder disease in her mother; Heart Problems in her mother.   ROS:  Please see the history of present illness. Otherwise, complete review of systems is positive for none.  All other systems are reviewed and negative.   Physical Exam: VS:  BP 120/80   Pulse 88  Ht 5\' 5"  (1.651 m)   Wt 259 lb 9.6 oz (117.8 kg)   SpO2 99%   BMI 43.20 kg/m , BMI Body mass index is 43.2 kg/m.  Wt Readings from Last 3 Encounters:  05/04/19 259 lb 9.6 oz (117.8 kg)  12/07/18 254 lb 9.6 oz (115.5 kg)  12/02/18 255 lb (115.7 kg)    General: Patient appears comfortable at rest. Neck: Supple, no elevated JVP or carotid bruits, no thyromegaly. Lungs: Clear to auscultation,  nonlabored breathing at rest. Cardiac: Regular rate and rhythm occasional premature beat noted, no S3 or significant systolic murmur, no pericardial rub. Extremities: No pitting edema, distal pulses 2+. Skin: Warm and dry. Musculoskeletal: No kyphosis. Neuropsychiatric: Alert and oriented x3, affect grossly appropriate.  ECG:  An ECG dated 05/04/2019 was personally reviewed today and demonstrated:  Sinus rhythm with premature atrial complexes rate of 86  Recent Labwork: 12/07/2018: ALT 30; AST 27; BUN 11; Creatinine, Ser 0.86; Hemoglobin 13.8; Platelets 434; Potassium 4.2; Sodium 137  No results found for: CHOL, TRIG, HDL, CHOLHDL, VLDL, LDLCALC, LDLDIRECT  Other Studies Reviewed Today:  Echocardiogram on 10/08/2017 was normal, LVEF 60 to 65%.   10/13/2017 Event monitoring demonstrated sinus rhythm and sinus tachycardia with frequent PACs and isolated PVCs with an average heart rate of 103 bpm. Symptoms correlated with sinus tachycardia and PACs.   Assessment and Plan:  1. Palpitations   2. Uncomplicated asthma, unspecified asthma severity, unspecified whether persistent    1. Palpitations Patient states she has had no significant palpitations since last visit up until 3 weeks ago when palpitations started and have been persistent since that time.  She states sometimes she feels like her heart is going to beat out of her chest.  Advised patient to start metoprolol 12.5 mg p.o. twice daily for 2 weeks.  Advised her to check her blood pressure and heart rate daily.  If after 2 weeks of metoprolol 12.5 mg p.o. bid she continues to have palpitations to increase dose to 25 mg p.o. thereafter.  We will follow-up in 1 month via virtual visit.  Advised her to call if any orthostatic symptoms, or significantly slow heart rates.  Patient states she had recent lab work in January of this year at Dr. Delanna Ahmadi office.  We will obtain those results.  If TSH was not done at PCP office in January, we will  order it virtual visit  2. Uncomplicated asthma, unspecified asthma severity, unspecified whether persistent Patient denies any recent symptoms of dyspnea, wheezing.  She uses metered-dose inhaler as needed for asthma symptoms.  Medication Adjustments/Labs and Tests Ordered: Current medicines are reviewed at length with the patient today.  Concerns regarding medicines are outlined above.   Disposition: Follow-up with Dr. Bronson Ing or APP follow-up virtual visit in 1 month Signed, Levell July, NP 05/04/2019 10:14 AM    [facility] [place], [place], [place] [address] Phone: [phone]; Fax: [phone]

## 2019-05-04 ENCOUNTER — Other Ambulatory Visit: Payer: Self-pay

## 2019-05-04 ENCOUNTER — Telehealth: Payer: Self-pay | Admitting: Cardiovascular Disease

## 2019-05-04 ENCOUNTER — Encounter: Payer: Self-pay | Admitting: Family Medicine

## 2019-05-04 ENCOUNTER — Encounter: Payer: Self-pay | Admitting: *Deleted

## 2019-05-04 ENCOUNTER — Ambulatory Visit (INDEPENDENT_AMBULATORY_CARE_PROVIDER_SITE_OTHER): Payer: Commercial Managed Care - PPO | Admitting: Family Medicine

## 2019-05-04 VITALS — BP 120/80 | HR 88 | Ht 65.0 in | Wt 259.6 lb

## 2019-05-04 DIAGNOSIS — R002 Palpitations: Secondary | ICD-10-CM

## 2019-05-04 DIAGNOSIS — J45909 Unspecified asthma, uncomplicated: Secondary | ICD-10-CM | POA: Diagnosis not present

## 2019-05-04 MED ORDER — METOPROLOL SUCCINATE ER 25 MG PO TB24: 12.5000 mg | ORAL_TABLET | Freq: Every day | ORAL | 1 refills | Status: DC

## 2019-05-04 NOTE — Patient Instructions (Addendum)
Medication Instructions:    Your physician has recommended you make the following change in your medication:   Start metoprolol succinate 12.5 mg by mouth daily for 2 weeks, then increase to 25 mg daily if your blood pressure and heart rate is normal  Continue other medications the same  Labwork:  NONE  Testing/Procedures:  NONE  Follow-Up:  Your physician recommends that you schedule a follow-up appointment in: 1 month (virtual).  Any Other Special Instructions Will Be Listed Below (If Applicable). Your physician has requested that you regularly monitor and record your blood pressure and heart rate readings at home. Please use the same machine at the same time of day to check your readings and record them.   If you need a refill on your cardiac medications before your next appointment, please call your pharmacy.

## 2019-05-04 NOTE — Telephone Encounter (Signed)
  Patient Consent for Virtual Visit         Kathleen Stokes has provided verbal consent on 05/04/2019 for a virtual visit (video or telephone).   CONSENT FOR VIRTUAL VISIT FOR:  Kathleen Stokes  By participating in this virtual visit I agree to the following:  I hereby voluntarily request, consent and authorize [facility] and its employed or contracted physicians, physician assistants, nurse practitioners or other licensed health care professionals (the Practitioner), to provide me with telemedicine health care services (the "Services") as deemed necessary by the treating Practitioner. I acknowledge and consent to receive the Services by the Practitioner via telemedicine. I understand that the telemedicine visit will involve communicating with the Practitioner through live audiovisual communication technology and the disclosure of certain medical information by electronic transmission. I acknowledge that I have been given the opportunity to request an in-person assessment or other available alternative prior to the telemedicine visit and am voluntarily participating in the telemedicine visit.  I understand that I have the right to withhold or withdraw my consent to the use of telemedicine in the course of my care at any time, without affecting my right to future care or treatment, and that the Practitioner or I may terminate the telemedicine visit at any time. I understand that I have the right to inspect all information obtained and/or recorded in the course of the telemedicine visit and may receive copies of available information for a reasonable fee.  I understand that some of the potential risks of receiving the Services via telemedicine include:  Marland Kitchen Delay or interruption in medical evaluation due to technological equipment failure or disruption; . Information transmitted may not be sufficient (e.g. poor resolution of images) to allow for appropriate medical decision making by the Practitioner;  and/or  . In rare instances, security protocols could fail, causing a breach of personal health information.  Furthermore, I acknowledge that it is my responsibility to provide information about my medical history, conditions and care that is complete and accurate to the best of my ability. I acknowledge that Practitioner's advice, recommendations, and/or decision may be based on factors not within their control, such as incomplete or inaccurate data provided by me or distortions of diagnostic images or specimens that may result from electronic transmissions. I understand that the practice of medicine is not an exact science and that Practitioner makes no warranties or guarantees regarding treatment outcomes. I acknowledge that a copy of this consent can be made available to me via my patient portal ([facility]), or I can request a printed copy by calling the office of [facility].    I understand that my insurance will be billed for this visit.   I have read or had this consent read to me. . I understand the contents of this consent, which adequately explains the benefits and risks of the Services being provided via telemedicine.  . I have been provided ample opportunity to ask questions regarding this consent and the Services and have had my questions answered to my satisfaction. . I give my informed consent for the services to be provided through the use of telemedicine in my medical care

## 2019-05-05 ENCOUNTER — Telehealth: Payer: Self-pay | Admitting: *Deleted

## 2019-05-05 DIAGNOSIS — Z79899 Other long term (current) drug therapy: Secondary | ICD-10-CM

## 2019-05-05 DIAGNOSIS — E785 Hyperlipidemia, unspecified: Secondary | ICD-10-CM

## 2019-05-05 MED ORDER — ATORVASTATIN CALCIUM 10 MG PO TABS: 10.0000 mg | ORAL_TABLET | Freq: Every day | ORAL | 3 refills | Status: DC

## 2019-05-05 MED ORDER — METOPROLOL TARTRATE 25 MG PO TABS: 12.5000 mg | ORAL_TABLET | Freq: Two times a day (BID) | ORAL | 1 refills | Status: DC

## 2019-05-05 NOTE — Telephone Encounter (Signed)
Patient informed and agrees and understands plan. Lab order faxed to [facility].

## 2019-05-05 NOTE — Telephone Encounter (Signed)
-----   Message from Verta Ellen., NP sent at 05/05/2019  4:32 PM EDT ----- Please call the patient and tell her cholesterol is elevated.  Her LDL is 142 and should be less than 100.  Ask her she would be willing to try atorvastatin 10 mg daily for elevated cholesterol.  If so please order that for her.  Thank you

## 2019-05-05 NOTE — Telephone Encounter (Signed)
Pt was seen yesterday and was told to take Toprol XL 12.5 mg bid for 1 week and then take 25 mg daily - rx says to take 12.5 mg daily for 1 week - will forward to provider for clarification

## 2019-05-05 NOTE — Telephone Encounter (Signed)
Clarification on medication with Jonni Sanger. Per Jonni Sanger, patient should be on lopressor. Patient Advised that she is supposed to have lopressor vs toprol. Apologized to patient for confusion on her prescriptions.. Clear instructions given for patient to stop toprol xl and start lopressor 12.5 mg BID for 2 weeks, if able to tolerate, increase to 25 mg twice daily. Verbalized understanding of plan.

## 2019-05-10 ENCOUNTER — Telehealth: Payer: Self-pay | Admitting: *Deleted

## 2019-05-10 ENCOUNTER — Other Ambulatory Visit: Payer: Self-pay | Admitting: *Deleted

## 2019-05-10 DIAGNOSIS — R002 Palpitations: Secondary | ICD-10-CM

## 2019-05-10 NOTE — Telephone Encounter (Signed)
Patient informed and verbalized understanding. Says she will go on Friday. Lab order faxed to [place].

## 2019-05-10 NOTE — Telephone Encounter (Signed)
-----   Message from Verta Ellen., NP sent at 05/10/2019  2:05 PM EDT ----- Regarding: RE: check for TSH when result comes from pcp I guess we need to order a TSH , T4 to see if her thyroid is over active and contributing to her palpitations. Thanks ----- Message ----- From: Merlene Laughter, RN Sent: 05/10/2019  12:39 PM EDT To: Verta Ellen., NP Subject: FW: check for TSH when result comes from pcp   No TSH  ----- Message ----- From: Merlene Laughter, RN Sent: 05/10/2019 To: Merlene Laughter, RN Subject: check for TSH when result comes from pcp

## 2019-05-13 LAB — TSH: TSH: 0.76 mIU/L

## 2019-05-13 LAB — T4, FREE: Free T4: 1.2 ng/dL (ref 0.8–1.8)

## 2019-05-13 LAB — T3, FREE: T3, Free: 2.8 pg/mL (ref 2.3–4.2)

## 2019-05-16 ENCOUNTER — Telehealth: Payer: Self-pay | Admitting: *Deleted

## 2019-05-16 NOTE — Telephone Encounter (Signed)
-----   Message from Verta Ellen., NP sent at 05/14/2019 12:20 AM EDT ----- Please call the patient and tell the thyroid function tests turned out normal. The thyroid is not contributing to her palpitations.

## 2019-05-16 NOTE — Telephone Encounter (Signed)
Patient informed via voicemail. Copy sent to PCP

## 2019-05-20 ENCOUNTER — Telehealth: Payer: Self-pay | Admitting: *Deleted

## 2019-05-20 NOTE — Telephone Encounter (Signed)
Pt increased Lopressor yesterday to 25 mg bid (as per recent OV) and c/o palpitations - denies SOB/dizziness/chest pain - pt will continue to take 25 mg bid of Lopressor and update [place] next week if she is unable to tolerate or if symptoms worsen - aware that if chest pain/dizziness/SOB or symptoms worsen over the week that would need ED evaluation

## 2019-06-02 ENCOUNTER — Encounter: Payer: Self-pay | Admitting: Cardiovascular Disease

## 2019-06-02 ENCOUNTER — Telehealth (INDEPENDENT_AMBULATORY_CARE_PROVIDER_SITE_OTHER): Payer: Commercial Managed Care - PPO | Admitting: Cardiovascular Disease

## 2019-06-02 VITALS — Ht 66.0 in | Wt 252.0 lb

## 2019-06-02 DIAGNOSIS — Z79899 Other long term (current) drug therapy: Secondary | ICD-10-CM | POA: Diagnosis not present

## 2019-06-02 DIAGNOSIS — R002 Palpitations: Secondary | ICD-10-CM

## 2019-06-02 MED ORDER — METOPROLOL TARTRATE 25 MG PO TABS: 37.5000 mg | ORAL_TABLET | Freq: Two times a day (BID) | ORAL | 6 refills | Status: DC

## 2019-06-02 NOTE — Patient Instructions (Signed)
Medication Instructions:   Increase Lopressor to 37.5mg  twice a day  - new prescription sent to [facility] today.   Continue all other medications.    Labwork: none  Testing/Procedures: none  Follow-Up: 6 months   Any Other Special Instructions Will Be Listed Below (If Applicable).  If you need a refill on your cardiac medications before your next appointment, please call your pharmacy.

## 2019-06-02 NOTE — Progress Notes (Signed)
Virtual Visit via Telephone Note   This visit type was conducted due to national recommendations for restrictions regarding the COVID-19 Pandemic (e.g. social distancing) in an effort to limit this patient's exposure and mitigate transmission in our community.  Due to her co-morbid illnesses, this patient is at least at moderate risk for complications without adequate follow up.  This format is felt to be most appropriate for this patient at this time.  The patient did not have access to video technology/had technical difficulties with video requiring transitioning to audio format only (telephone).  All issues noted in this document were discussed and addressed.  No physical exam could be performed with this format.  Please refer to the patient's chart for her  consent to telehealth for [facility].   The patient was identified using 2 identifiers.  Date:  06/02/2019   ID:  Kathleen Stokes, DOB 06/10/1975, MRN [id number]  Patient Location: Home Provider Location: Office  PCP:  Sharilyn Sites, MD  Cardiologist:  Kate Sable, MD  Electrophysiologist:  None   Evaluation Performed:  Follow-Up Visit  Chief Complaint: Palpitations  History of Present Illness:    Kathleen Stokes is a 43 y.o. female with palpitations.  The dose of metoprolol was recently increased to 25 mg twice daily.  She said it is helped to a mild degree but she continues to have palpitations which make her feel uncomfortable.  She denies chest pain, shortness of breath, dizziness, and leg swelling.  She has not checked her blood pressure and heart rate this morning.     Past Medical History:  Diagnosis Date  . Asthma   . GERD (gastroesophageal reflux disease)   . Palpitations   . Pre-diabetes   . Prediabetes    Past Surgical History:  Procedure Laterality Date  . CATARACT EXTRACTION Left   . CHOLECYSTECTOMY N/A 12/08/2018   Procedure: LAPAROSCOPIC CHOLECYSTECTOMY;  Surgeon: Virl Cagey, MD;   Location: [facility];  Service: General;  Laterality: N/A;     Current Meds  Medication Sig  . albuterol (PROVENTIL HFA;VENTOLIN HFA) 108 (90 Base) MCG/ACT inhaler Inhale 2 puffs into the lungs every 6 (six) hours as needed for wheezing or shortness of breath.  . esomeprazole (NEXIUM) 20 MG capsule Take 40 mg by mouth daily.   . fluticasone (FLONASE) 50 MCG/ACT nasal spray Place 1 spray into both nostrils daily.   Marland Kitchen liraglutide (VICTOZA) 18 MG/3ML SOPN Inject 1.8 mg into the skin at bedtime.   . metoprolol tartrate (LOPRESSOR) 25 MG tablet Take 25 mg by mouth 2 (two) times daily.     Allergies:   Penicillins   Social History   Tobacco Use  . Smoking status: Never Smoker  . Smokeless tobacco: Never Used  Substance Use Topics  . Alcohol use: Not Currently  . Drug use: Never     Family Hx: The patient's family history includes Gallbladder disease in her mother; Heart Problems in her mother.  ROS:   Please see the history of present illness.     All other systems reviewed and are negative.   Prior CV studies:   The following studies were reviewed today:  Echocardiogram on 10/08/2017 was normal, LVEF 60 to 65%.  Event monitoring demonstrated sinus rhythm and sinus tachycardia with frequent PACs and isolated PVCs with an average heart rate of 103 bpm. Symptoms correlated with sinus tachycardia and PACs.   Labs/Other Tests and Data Reviewed:    EKG:  No ECG reviewed.  Recent Labs: 12/07/2018:  ALT 30; BUN 11; Creatinine, Ser 0.86; Hemoglobin 13.8; Platelets 434; Potassium 4.2; Sodium 137 05/13/2019: TSH 0.76   Recent Lipid Panel No results found for: CHOL, TRIG, HDL, CHOLHDL, LDLCALC, LDLDIRECT  Wt Readings from Last 3 Encounters:  06/02/19 252 lb (114.3 kg)  05/04/19 259 lb 9.6 oz (117.8 kg)  12/07/18 254 lb 9.6 oz (115.5 kg)     Objective:    Vital Signs:  Ht 5\' 6"  (1.676 m)   Wt 252 lb (114.3 kg)   BMI 40.67 kg/m    VITAL SIGNS:  reviewed  ASSESSMENT & PLAN:     1.  Palpitations: Currently on metoprolol tartrate 25 mg twice daily with the dose recently increased from once to twice daily.  Thyroid function is normal.  She continues to have palpitations.  She will check her blood pressure and heart rate and call us back.  If they are reasonable, I would consider increasing the dose of Lopressor to 37.5 mg twice daily.    COVID-19 Education: The signs and symptoms of COVID-19 were discussed with the patient and how to seek care for testing (follow up with PCP or arrange E-visit).  The importance of social distancing was discussed today.  Time:   Today, I have spent 15 minutes with the patient with telehealth technology discussing the above problems.     Medication Adjustments/Labs and Tests Ordered: Current medicines are reviewed at length with the patient today.  Concerns regarding medicines are outlined above.   Tests Ordered: No orders of the defined types were placed in this encounter.   Medication Changes: No orders of the defined types were placed in this encounter.   Follow Up:  Virtual Visit  in 6 month(s) with APP  Signed, Kate Sable, MD  06/02/2019 9:53 AM    [place]

## 2019-06-02 NOTE — Addendum Note (Signed)
Addended by: Laurine Blazer on: 06/02/2019 03:18 PM   Modules accepted: Orders

## 2019-06-15 ENCOUNTER — Telehealth: Payer: Self-pay | Admitting: Cardiovascular Disease

## 2019-06-15 NOTE — Telephone Encounter (Signed)
Patient called stating that the last telephone visit she had with Dr. Bronson Ing she was told to check her BP . States that she checked her BP called back to the office to give BP. She has not heard back from Dr. Bronson Ing in regards to what she needs to do next.  [phone]

## 2019-06-27 NOTE — Telephone Encounter (Signed)
Left multiple messages

## 2019-07-10 ENCOUNTER — Emergency Department (HOSPITAL_COMMUNITY): Payer: Commercial Managed Care - PPO

## 2019-07-10 ENCOUNTER — Other Ambulatory Visit: Payer: Self-pay

## 2019-07-10 ENCOUNTER — Emergency Department (HOSPITAL_COMMUNITY)
Admission: EM | Admit: 2019-07-10 | Discharge: 2019-07-10 | Disposition: A | Discharge status: Home / Self Care | Payer: Commercial Managed Care - PPO | Attending: Emergency Medicine | Admitting: Emergency Medicine

## 2019-07-10 ENCOUNTER — Encounter (HOSPITAL_COMMUNITY): Payer: Self-pay | Admitting: Emergency Medicine

## 2019-07-10 DIAGNOSIS — M7989 Other specified soft tissue disorders: Secondary | ICD-10-CM | POA: Diagnosis not present

## 2019-07-10 DIAGNOSIS — M79672 Pain in left foot: Secondary | ICD-10-CM | POA: Diagnosis not present

## 2019-07-10 DIAGNOSIS — M25572 Pain in left ankle and joints of left foot: Secondary | ICD-10-CM | POA: Diagnosis present

## 2019-07-10 DIAGNOSIS — Y92481 Parking lot as the place of occurrence of the external cause: Secondary | ICD-10-CM | POA: Diagnosis not present

## 2019-07-10 DIAGNOSIS — Y9389 Activity, other specified: Secondary | ICD-10-CM | POA: Insufficient documentation

## 2019-07-10 DIAGNOSIS — E669 Obesity, unspecified: Secondary | ICD-10-CM | POA: Diagnosis not present

## 2019-07-10 DIAGNOSIS — W010XXA Fall on same level from slipping, tripping and stumbling without subsequent striking against object, initial encounter: Secondary | ICD-10-CM | POA: Insufficient documentation

## 2019-07-10 DIAGNOSIS — Y999 Unspecified external cause status: Secondary | ICD-10-CM | POA: Insufficient documentation

## 2019-07-10 DIAGNOSIS — J45909 Unspecified asthma, uncomplicated: Secondary | ICD-10-CM | POA: Insufficient documentation

## 2019-07-10 DIAGNOSIS — Z79899 Other long term (current) drug therapy: Secondary | ICD-10-CM | POA: Diagnosis not present

## 2019-07-10 NOTE — ED Provider Notes (Signed)
[facility] Provider Note   CSN: [id number] Arrival date & time: 07/10/19  1043     History Chief Complaint  Patient presents with  . Foot Pain    Kathleen Stokes is a 44 y.o. female history obesity, GERD, prediabetes, asthma.  Patient presents today for left ankle pain after fall that occurred around 10 AM this morning.  Patient was walking in a parking lot wearing wedges when she reports her left foot slipped off the edge of her shoe causing her to twist her ankle and fall.  Patient was able to catch herself on her hands and denies falling completely to the ground.  She reports immediately after her fall she began having lateral left ankle pain described as a constant moderate intensity ache worsened with palpation and ambulation, nonradiating.  Denies head injury, loss consciousness, headache, blood thinner use, neck pain, back pain, chest pain, abdominal pain, numbness/weakness, tingling, pain of the other extremities, wound or any additional concerns. HPI     Past Medical History:  Diagnosis Date  . Asthma   . GERD (gastroesophageal reflux disease)   . Palpitations   . Pre-diabetes   . Prediabetes     Patient Active Problem List   Diagnosis Date Noted  . Biliary dyskinesia 12/05/2018    Past Surgical History:  Procedure Laterality Date  . CATARACT EXTRACTION Left   . CHOLECYSTECTOMY N/A 12/08/2018   Procedure: LAPAROSCOPIC CHOLECYSTECTOMY;  Surgeon: Virl Cagey, MD;  Location: [facility];  Service: General;  Laterality: N/A;     OB History    Gravida  1   Para  1   Term  1   Preterm      AB      Living  1     SAB      TAB      Ectopic      Multiple      Live Births              Family History  Problem Relation Age of Onset  . Heart Problems Mother   . Gallbladder disease Mother     Social History   Tobacco Use  . Smoking status: Never Smoker  . Smokeless tobacco: Never Used  Substance Use Topics  . Alcohol  use: Not Currently  . Drug use: Never    Home Medications Prior to Admission medications   Medication Sig Start Date End Date Taking? Authorizing Provider  albuterol (PROVENTIL HFA;VENTOLIN HFA) 108 (90 Base) MCG/ACT inhaler Inhale 2 puffs into the lungs every 6 (six) hours as needed for wheezing or shortness of breath. 05/11/18   Herminio Commons, MD  atorvastatin (LIPITOR) 10 MG tablet Take 1 tablet (10 mg total) by mouth daily. Patient not taking: Reported on 06/02/2019 05/05/19 08/03/19  Verta Ellen., NP  esomeprazole (NEXIUM) 20 MG capsule Take 40 mg by mouth daily.     [provider]  fluticasone (FLONASE) 50 MCG/ACT nasal spray Place 1 spray into both nostrils daily.     [provider]  liraglutide (VICTOZA) 18 MG/3ML SOPN Inject 1.8 mg into the skin at bedtime.     [provider]  metoprolol tartrate (LOPRESSOR) 25 MG tablet Take 1.5 tablets (37.5 mg total) by mouth 2 (two) times daily. 06/02/19 08/01/19  Herminio Commons, MD    Allergies    Penicillins  Review of Systems   Review of Systems  Constitutional: Negative.  Negative for chills and fever.  Cardiovascular:  Negative.  Negative for chest pain.  Gastrointestinal: Negative.  Negative for abdominal pain.  Musculoskeletal: Positive for arthralgias (Left ankle). Negative for back pain and neck pain.  Skin: Negative.  Negative for color change and wound.  Neurological: Negative.  Negative for syncope, weakness, numbness and headaches.    Physical Exam Updated Vital Signs BP (!) 123/94 (BP Location: Right Arm)   Pulse 81   Temp 98.3 F (36.8 C) (Oral)   Resp 20   Ht 5\' 5"  (1.651 m)   Wt 115.7 kg   SpO2 100%   BMI 42.43 kg/m   Physical Exam Constitutional:      General: She is not in acute distress.    Appearance: Normal appearance. She is well-developed. She is obese. She is not ill-appearing or diaphoretic.  HENT:     Head: Normocephalic and atraumatic.  Eyes:     General:  Vision grossly intact. Gaze aligned appropriately.     Pupils: Pupils are equal, round, and reactive to light.  Neck:     Trachea: Trachea and phonation normal.  Cardiovascular:     Rate and Rhythm: Normal rate and regular rhythm.     Pulses:          Dorsalis pedis pulses are 2+ on the right side and 2+ on the left side.  Pulmonary:     Effort: Pulmonary effort is normal. No respiratory distress.  Abdominal:     General: There is no distension.     Palpations: Abdomen is soft.     Tenderness: There is no abdominal tenderness. There is no guarding or rebound.  Musculoskeletal:        General: Normal range of motion.     Cervical back: Normal range of motion.       Feet:     Comments: No midline C/T/L spinal tenderness to palpation, no paraspinal muscle tenderness, no deformity, crepitus, or step-off noted.  Patient able to bring bilateral knees towards the chest without pain.  Good range of motion of all major joints of the bilateral upper extremities without pain.  No skin break of the hands, good capillary refill and sensation to all fingers.  No evidence of significant injury of the upper extremities. - No tenderness to palpation of the chest or abdomen. - Left ankle with lateral malleoli are tenderness to palpation, mild swelling, no skin break erythema fluctuance or induration.  Flexion and extension at the left ankle is intact with minimal increase in pain.  Achilles tendon intact without tenderness to palpation.  Inversion and eversion intact of the left ankle with some increase in pain.  Good capillary refill and sensation to all toes.  Strong and equal pedal pulses.  Good range of motion at the left knee and hip without pain.   Feet:     Right foot:     Protective Sensation: 5 sites tested. 5 sites sensed.     Skin integrity: Skin integrity normal.     Left foot:     Protective Sensation: 5 sites tested. 5 sites sensed.     Skin integrity: Skin integrity normal.  Skin:     General: Skin is warm and dry.  Neurological:     Mental Status: She is alert.     GCS: GCS eye subscore is 4. GCS verbal subscore is 5. GCS motor subscore is 6.     Comments: Speech is clear and goal oriented, follows commands Major Cranial nerves without deficit, no facial droop Moves extremities without  ataxia, coordination intact  Psychiatric:        Behavior: Behavior normal.     ED Results / Procedures / Treatments   Labs (all labs ordered are listed, but only abnormal results are displayed) Labs Reviewed - No data to display  EKG None  Radiology DG Ankle Complete Left  Result Date: 07/10/2019 CLINICAL DATA:  LEFT foot and ankle pain after falling today. Swelling. EXAM: LEFT ANKLE COMPLETE - 3+ VIEW COMPARISON:  None. FINDINGS: There is diffuse soft tissue swelling of the LOWER leg and LATERAL portion of the LEFT ankle. There is no acute fracture or subluxation. Mortise is intact. IMPRESSION: Soft tissue swelling. Electronically Signed   By: Nolon Nations M.D.   On: 07/10/2019 11:40   DG Foot Complete Left  Result Date: 07/10/2019 CLINICAL DATA:  LEFT foot and ankle pain after falling today. Swelling. EXAM: LEFT FOOT - COMPLETE 3+ VIEW COMPARISON:  None. FINDINGS: There is no evidence of fracture or dislocation. There is no evidence of arthropathy or other focal bone abnormality. Soft tissues are unremarkable. IMPRESSION: Negative. Electronically Signed   By: Nolon Nations M.D.   On: 07/10/2019 11:39    Procedures Procedures (including critical care time)  Medications Ordered in ED Medications - No data to display  ED Course  I have reviewed the triage vital signs and the nursing notes.  Pertinent labs & imaging results that were available during my care of the patient were reviewed by me and considered in my medical decision making (see chart for details).    MDM Rules/Calculators/A&P                     Additional History Obtained: 1. Nursing notes from this  visit.  DG Left Ankle:  IMPRESSION:  Soft tissue swelling.   DG Left Foot:  IMPRESSION:  Negative.   I have personally reviewed both the patient's x-rays and agree with radiologist interpretation. - Suspect patient has ligamentous injury of the left ankle possibly ATFL.  She has good active range of motion at the left ankle with some increase in pain.  Achilles tendon is intact.  There are no skin breaks.  No evidence of cellulitis, septic arthritis, DVT, compartment syndrome, neurovascular compromise or other emergent pathologies.  She has strong equal pedal pulses good capillary refill and sensation.  Patient was placed in a cam walker and given crutches.  She will continue using RICE therapy and OTC anti-inflammatories.  She was given referral to orthopedist Dr. Aline Brochure, encouraged to call his office today to schedule follow-up appointment.  She is aware of potential of unseen fractures and likelihood of soft tissue likely ligamentous injury and that follow-up with orthopedist and PCP is recommended.  At this time there does not appear to be any evidence of an acute emergency medical condition and the patient appears stable for discharge with appropriate outpatient follow up. Diagnosis was discussed with patient who verbalizes understanding of care plan and is agreeable to discharge. I have discussed return precautions with patient who verbalizes understanding. Patient encouraged to follow-up with their PCP and orthopedist. All questions answered.   Note: Portions of this report may have been transcribed using voice recognition software. Every effort was made to ensure accuracy; however, inadvertent computerized transcription errors may still be present. Final Clinical Impression(s) / ED Diagnoses Final diagnoses:  Acute left ankle pain    Rx / DC Orders ED Discharge Orders    None       Athena Masse,  Camillia Herter, PA-C 07/10/19 1259    Margette Fast, MD 07/11/19 1322

## 2019-07-10 NOTE — ED Notes (Signed)
Pt reports wore different shoes to church today   Walking in parking lot and foot twisted   Now with pain to her upper foot with "a knot"  Here for eval

## 2019-07-10 NOTE — Discharge Instructions (Addendum)
At this time there does not appear to be the presence of an emergent medical condition, however there is always the potential for conditions to change. Please read and follow the below instructions.  Please return to the Emergency Department immediately for any new or worsening symptoms. Please be sure to follow up with your Primary Care Provider within one week regarding your visit today; please call their office to schedule an appointment even if you are feeling better for a follow-up visit. Please call the orthopedic specialist Dr. Aline Brochure on discharge paperwork for follow-up appointment for further evaluation of your left ankle pain.  As we discussed there is a possibility ligamentous, tendon or other soft tissue injuries today.  Additionally there is a possibility of unseen broken bones.  It is important to follow-up with the orthopedic specialist and your primary care doctor for reevaluation, call their offices today to schedule appointments.  Please use rest ice and elevation to help with pain and swelling.  You may use the cam walker and crutches provided to you today to help protect your ankle from further injury.  Get help right away if: Your foot, leg, toes, or ankle: Tingles or becomes numb. Becomes swollen. Turns pale or blue. You have fever or chills You have any new/concerning or worsening of symptoms  Please read the additional information packets attached to your discharge summary.  Do not take your medicine if  develop an itchy rash, swelling in your mouth or lips, or difficulty breathing; call 911 and seek immediate emergency medical attention if this occurs.  Note: Portions of this text may have been transcribed using voice recognition software. Every effort was made to ensure accuracy; however, inadvertent computerized transcription errors may still be present.

## 2019-07-10 NOTE — ED Notes (Signed)
Pt a bit nonplussed upon leaving as she understood that she would get a work not being off for 3 weeks  Work note off until the 10th  She is educated that she should follow up with Dr Aline Brochure and should she need to be out longer he will recommend

## 2019-07-10 NOTE — ED Triage Notes (Signed)
Patient c/o left foot and ankle pain after falling today. Per patient wearing cork platform shoes today and foot slipped out while walking causing her to fall. Patient cuaght herself with hands. Denies hitting head. Swelling to foot and ankle noted.

## 2019-07-12 ENCOUNTER — Encounter: Payer: Self-pay | Admitting: Orthopaedic Surgery

## 2019-07-12 ENCOUNTER — Other Ambulatory Visit: Payer: Self-pay

## 2019-07-12 ENCOUNTER — Ambulatory Visit (INDEPENDENT_AMBULATORY_CARE_PROVIDER_SITE_OTHER): Payer: Commercial Managed Care - PPO | Admitting: Orthopaedic Surgery

## 2019-07-12 VITALS — BP 162/71 | HR 86 | Ht 65.0 in | Wt 255.0 lb

## 2019-07-12 DIAGNOSIS — S96912A Strain of unspecified muscle and tendon at ankle and foot level, left foot, initial encounter: Secondary | ICD-10-CM | POA: Diagnosis not present

## 2019-07-12 MED ORDER — NAPROXEN 500 MG PO TABS: 500.0000 mg | ORAL_TABLET | Freq: Two times a day (BID) | ORAL | 5 refills | Status: DC

## 2019-07-12 NOTE — Patient Instructions (Signed)
Out of work note until next appointment. °

## 2019-07-12 NOTE — Progress Notes (Signed)
Subjective:    Patient ID: Kathleen Stokes, female    DOB: [birth year], 44 y.o.   MRN: [id number]  HPI She strained her left ankle on 07-10-2019 in parking lot at church.  She went to the ER.  X-rays were done in the ER which showed: FINDINGS: There is diffuse soft tissue swelling of the LOWER leg and LATERAL portion of the LEFT ankle. There is no acute fracture or subluxation. Mortise is intact.  IMPRESSION: Soft tissue swelling.  I have independently reviewed and interpreted x-rays of this patient done at another site by another physician or qualified health professional.  She was given CAM walker and told about elevation.  She has no other injury.  She still has some pain today, lateral swelling but no redness.   Review of Systems  Constitutional: Positive for activity change.  Musculoskeletal: Positive for arthralgias, gait problem and joint swelling.  All other systems reviewed and are negative.  For Review of Systems, all other systems reviewed and are negative.  The following is a summary of the past history medically, past history surgically, known current medicines, social history and family history.  This information is gathered electronically by the computer from prior information and documentation.  I review this each visit and have found including this information at this point in the chart is beneficial and informative.   Past Medical History:  Diagnosis Date  . Asthma   . GERD (gastroesophageal reflux disease)   . Palpitations   . Pre-diabetes   . Prediabetes     Past Surgical History:  Procedure Laterality Date  . CATARACT EXTRACTION Left   . CHOLECYSTECTOMY N/A 12/08/2018   Procedure: LAPAROSCOPIC CHOLECYSTECTOMY;  Surgeon: Virl Cagey, MD;  Location: [facility];  Service: General;  Laterality: N/A;    Current Outpatient Medications on File Prior to Visit  Medication Sig Dispense Refill  . albuterol (PROVENTIL HFA;VENTOLIN HFA) 108 (90 Base) MCG/ACT  inhaler Inhale 2 puffs into the lungs every 6 (six) hours as needed for wheezing or shortness of breath. 1 Inhaler 0  . atorvastatin (LIPITOR) 10 MG tablet Take 1 tablet (10 mg total) by mouth daily. 90 tablet 3  . esomeprazole (NEXIUM) 20 MG capsule Take 40 mg by mouth daily.     . fluticasone (FLONASE) 50 MCG/ACT nasal spray Place 1 spray into both nostrils daily.     Marland Kitchen liraglutide (VICTOZA) 18 MG/3ML SOPN Inject 1.8 mg into the skin at bedtime.     . metoprolol tartrate (LOPRESSOR) 25 MG tablet Take 1.5 tablets (37.5 mg total) by mouth 2 (two) times daily. 90 tablet 6   No current facility-administered medications on file prior to visit.    Social History   Socioeconomic History  . Marital status: Single    Spouse name: Not on file  . Number of children: Not on file  . Years of education: Not on file  . Highest education level: Not on file  Occupational History  . Not on file  Tobacco Use  . Smoking status: Never Smoker  . Smokeless tobacco: Never Used  Substance and Sexual Activity  . Alcohol use: Not Currently  . Drug use: Never  . Sexual activity: Yes  Other Topics Concern  . Not on file  Social History Narrative  . Not on file   Social Determinants of Health   Financial Resource Strain:   . Difficulty of Paying Living Expenses:   Food Insecurity:   . Worried About Charity fundraiser in  the Last Year:   . [place] in the Last Year:   Transportation Needs:   . Film/video editor (Medical):   Marland Kitchen Lack of Transportation (Non-Medical):   Physical Activity:   . Days of Exercise per Week:   . Minutes of Exercise per Session:   Stress:   . Feeling of Stress :   Social Connections:   . Frequency of Communication with Friends and Family:   . Frequency of Social Gatherings with Friends and Family:   . Attends Religious Services:   . Active Member of Clubs or Organizations:   . Attends Archivist Meetings:   Marland Kitchen Marital Status:   Intimate Partner  Violence:   . Fear of Current or Ex-Partner:   . Emotionally Abused:   Marland Kitchen Physically Abused:   . Sexually Abused:     Family History  Problem Relation Age of Onset  . Heart Problems Mother   . Gallbladder disease Mother     BP (!) 162/71   Pulse 86   Ht 5\' 5"  (1.651 m)   Wt 255 lb (115.7 kg)   BMI 42.43 kg/m   Body mass index is 42.43 kg/m.     Objective:   Physical Exam Vitals and nursing note reviewed.  Constitutional:      Appearance: She is well-developed.  HENT:     Head: Normocephalic and atraumatic.  Eyes:     Conjunctiva/sclera: Conjunctivae normal.     Pupils: Pupils are equal, round, and reactive to light.  Cardiovascular:     Rate and Rhythm: Normal rate and regular rhythm.  Pulmonary:     Effort: Pulmonary effort is normal.  Abdominal:     Palpations: Abdomen is soft.  Musculoskeletal:     Cervical back: Normal range of motion and neck supple.       Feet:  Skin:    General: Skin is warm and dry.  Neurological:     Mental Status: She is alert and oriented to person, place, and time.     Cranial Nerves: No cranial nerve deficit.     Motor: No abnormal muscle tone.     Coordination: Coordination normal.     Deep Tendon Reflexes: Reflexes are normal and symmetric. Reflexes normal.  Psychiatric:        Behavior: Behavior normal.        Thought Content: Thought content normal.        Judgment: Judgment normal.    I have reviewed the ER record.      Assessment & Plan:   Encounter Diagnosis  Name Primary?  . Strain of left ankle, initial encounter Yes   Begin Contrast Baths, sheet of instructions given.  Continue the CAM walker and crutches.  Begin Naprosyn 500 po bid pc, Rx called in.  Return in two weeks.  Stay out of work.  Call if any problem.  Precautions discussed.   Electronically Signed Sanjuana Kava, MD 6/8/202112:20 PM

## 2019-07-26 ENCOUNTER — Encounter: Payer: Self-pay | Admitting: Orthopaedic Surgery

## 2019-07-26 ENCOUNTER — Ambulatory Visit (INDEPENDENT_AMBULATORY_CARE_PROVIDER_SITE_OTHER): Payer: Commercial Managed Care - PPO | Admitting: Orthopaedic Surgery

## 2019-07-26 ENCOUNTER — Other Ambulatory Visit: Payer: Self-pay

## 2019-07-26 VITALS — BP 127/81 | HR 83 | Ht 65.0 in | Wt 253.0 lb

## 2019-07-26 DIAGNOSIS — S96912A Strain of unspecified muscle and tendon at ankle and foot level, left foot, initial encounter: Secondary | ICD-10-CM | POA: Diagnosis not present

## 2019-07-26 NOTE — Progress Notes (Signed)
Patient Kathleen Stokes, female DOB:1975-07-10, 44 y.o. DGU:[id number]  Chief Complaint  Patient presents with  . Ankle Pain    left     HPI  Kathleen Stokes is a 44 y.o. female who has left foot and ankle pain.  She is wearing the CAM walker.  She has less pain but has more swelling of the dorsum of the left foot now.  She has no new trauma.  She has no redness.  She is better but still has limp and need to be out of work.  I will see her in two weeks.   Body mass index is 42.1 kg/m.  ROS  Review of Systems  Constitutional: Positive for activity change.  Musculoskeletal: Positive for arthralgias, gait problem and joint swelling.  All other systems reviewed and are negative.   All other systems reviewed and are negative.  The following is a summary of the past history medically, past history surgically, known current medicines, social history and family history.  This information is gathered electronically by the computer from prior information and documentation.  I review this each visit and have found including this information at this point in the chart is beneficial and informative.    Past Medical History:  Diagnosis Date  . Asthma   . GERD (gastroesophageal reflux disease)   . Palpitations   . Pre-diabetes   . Prediabetes     Past Surgical History:  Procedure Laterality Date  . CATARACT EXTRACTION Left   . CHOLECYSTECTOMY N/A 12/08/2018   Procedure: LAPAROSCOPIC CHOLECYSTECTOMY;  Surgeon: Virl Cagey, MD;  Location: [facility];  Service: General;  Laterality: N/A;    Family History  Problem Relation Age of Onset  . Heart Problems Mother   . Gallbladder disease Mother     Social History Social History   Tobacco Use  . Smoking status: Never Smoker  . Smokeless tobacco: Never Used  Vaping Use  . Vaping Use: Never used  Substance Use Topics  . Alcohol use: Not Currently  . Drug use: Never    Allergies  Allergen Reactions  . Penicillins Rash    Did  it involve swelling of the face/tongue/throat, SOB, or low BP? No Did it involve sudden or severe rash/hives, skin peeling, or any reaction on the inside of your mouth or nose? No Did you need to seek medical attention at a hospital or doctor's office? No When did it last happen?20 years If all above answers are "NO", may proceed with cephalosporin use.    Current Outpatient Medications  Medication Sig Dispense Refill  . albuterol (PROVENTIL HFA;VENTOLIN HFA) 108 (90 Base) MCG/ACT inhaler Inhale 2 puffs into the lungs every 6 (six) hours as needed for wheezing or shortness of breath. 1 Inhaler 0  . atorvastatin (LIPITOR) 10 MG tablet Take 1 tablet (10 mg total) by mouth daily. 90 tablet 3  . esomeprazole (NEXIUM) 20 MG capsule Take 40 mg by mouth daily.     . fluticasone (FLONASE) 50 MCG/ACT nasal spray Place 1 spray into both nostrils daily.     Marland Kitchen liraglutide (VICTOZA) 18 MG/3ML SOPN Inject 1.8 mg into the skin at bedtime.     . metoprolol tartrate (LOPRESSOR) 25 MG tablet Take 1.5 tablets (37.5 mg total) by mouth 2 (two) times daily. 90 tablet 6  . naproxen (NAPROSYN) 500 MG tablet Take 1 tablet (500 mg total) by mouth 2 (two) times daily with a meal. 60 tablet 5   No current facility-administered medications for this visit.  Physical Exam  Blood pressure 127/81, pulse 83, height 5\' 5"  (1.651 m), weight 253 lb (114.8 kg).  Constitutional: overall normal hygiene, normal nutrition, well developed, normal grooming, normal body habitus. Assistive device:CAM walker left  Musculoskeletal: gait and station Limp left, muscle tone and strength are normal, no tremors or atrophy is present.  .  Neurological: coordination overall normal.  Deep tendon reflex/nerve stretch intact.  Sensation normal.  Cranial nerves II-XII intact.   Skin:   Normal overall no scars, lesions, ulcers or rashes. No psoriasis.  Psychiatric: Alert and oriented x 3.  Recent memory intact, remote memory  unclear.  Normal mood and affect. Well groomed.  Good eye contact.  Cardiovascular: overall no swelling, no varicosities, no edema bilaterally, normal temperatures of the legs and arms, no clubbing, cyanosis and good capillary refill.  Lymphatic: palpation is normal.  Left foot has some swelling more dorsally, left ankle has good ROM.  NV intact.  Limp left.  All other systems reviewed and are negative   The patient has been educated about the nature of the problem(s) and counseled on treatment options.  The patient appeared to understand what I have discussed and is in agreement with it.  Encounter Diagnosis  Name Primary?  . Strain of left ankle, initial encounter Yes    PLAN Call if any problems.  Precautions discussed.  Continue current medications.   Return to clinic 2 weeks   Out of work.  Electronically Signed Sanjuana Kava, MD 6/22/20219:30 AM

## 2019-07-27 ENCOUNTER — Ambulatory Visit: Payer: Commercial Managed Care - PPO | Attending: Internal Medicine

## 2019-07-27 DIAGNOSIS — Z20822 Contact with and (suspected) exposure to covid-19: Secondary | ICD-10-CM

## 2019-07-28 LAB — SARS-COV-2, NAA 2 DAY TAT

## 2019-07-28 LAB — NOVEL CORONAVIRUS, NAA: SARS-CoV-2, NAA: NOT DETECTED

## 2019-08-09 ENCOUNTER — Encounter: Payer: Self-pay | Admitting: Orthopaedic Surgery

## 2019-08-09 ENCOUNTER — Ambulatory Visit (INDEPENDENT_AMBULATORY_CARE_PROVIDER_SITE_OTHER): Payer: Commercial Managed Care - PPO | Admitting: Orthopaedic Surgery

## 2019-08-09 ENCOUNTER — Other Ambulatory Visit: Payer: Self-pay

## 2019-08-09 VITALS — BP 123/80 | HR 92 | Ht 65.0 in | Wt 253.0 lb

## 2019-08-09 DIAGNOSIS — S96912D Strain of unspecified muscle and tendon at ankle and foot level, left foot, subsequent encounter: Secondary | ICD-10-CM

## 2019-08-09 NOTE — Progress Notes (Signed)
My ankle is better  She is wearing flip flops today.   She is walking well.  She has only slight swelling of the left foot/ankle.  NV intact.  She has no pain.  Encounter Diagnosis  Name Primary?  . Strain of left ankle and foot, subsequent encounter Yes   I will see as needed.  Call if any problem.  Precautions discussed.   Electronically Signed Sanjuana Kava, MD 7/6/20219:19 AM

## 2019-10-24 ENCOUNTER — Encounter: Payer: Self-pay | Admitting: Cardiology

## 2019-10-24 ENCOUNTER — Ambulatory Visit (INDEPENDENT_AMBULATORY_CARE_PROVIDER_SITE_OTHER): Payer: Commercial Managed Care - PPO | Admitting: Cardiology

## 2019-10-24 VITALS — BP 112/68 | HR 72 | Ht 65.0 in | Wt 266.0 lb

## 2019-10-24 DIAGNOSIS — R002 Palpitations: Secondary | ICD-10-CM | POA: Diagnosis not present

## 2019-10-24 NOTE — Progress Notes (Signed)
Cardiology Office Note  Date: 10/24/2019   ID: Kathleen Stokes, DOB 1975-09-20, MRN [id number]  PCP:  Sharilyn Sites, MD  Cardiologist:  Rozann Lesches, MD Electrophysiologist:  None   Chief Complaint  Patient presents with  . Cardiac follow-up    History of Present Illness: Kathleen Stokes is a 44 y.o. female former patient of Dr. Bronson Ing now presenting to establish follow-up with me. I reviewed her records and updated the chart. She was last assessed via telehealth encounter in April.  She has history of palpitations with documented PACs by cardiac monitor and treatment with beta-blocker therapy.  She reports intermittent symptoms, sometimes precipitated by anxiety or stressful situations.  She has already increased her Lopressor to 50 mg twice daily within the last few days due to symptoms, feels somewhat better. No associated syncope.  Past Medical History:  Diagnosis Date  . Asthma   . GERD (gastroesophageal reflux disease)   . Palpitations   . Prediabetes     Past Surgical History:  Procedure Laterality Date  . CATARACT EXTRACTION Left   . CHOLECYSTECTOMY N/A 12/08/2018   Procedure: LAPAROSCOPIC CHOLECYSTECTOMY;  Surgeon: Virl Cagey, MD;  Location: [facility];  Service: General;  Laterality: N/A;    Current Outpatient Medications  Medication Sig Dispense Refill  . albuterol (PROVENTIL HFA;VENTOLIN HFA) 108 (90 Base) MCG/ACT inhaler Inhale 2 puffs into the lungs every 6 (six) hours as needed for wheezing or shortness of breath. 1 Inhaler 0  . atorvastatin (LIPITOR) 10 MG tablet Take 1 tablet (10 mg total) by mouth daily. 90 tablet 3  . esomeprazole (NEXIUM) 20 MG capsule Take 40 mg by mouth daily.     . fluticasone (FLONASE) 50 MCG/ACT nasal spray Place 1 spray into both nostrils daily.     Marland Kitchen METFORMIN HCL PO Take 500 mg by mouth in the morning and at bedtime.    . metoprolol tartrate (LOPRESSOR) 25 MG tablet Take 1.5 tablets (37.5 mg total) by mouth 2 (two)  times daily. 90 tablet 6   No current facility-administered medications for this visit.   Allergies:  Penicillins   ROS:   No syncope.  Physical Exam: VS:  BP 112/68   Pulse 72   Ht 5\' 5"  (1.651 m)   Wt 266 lb (120.7 kg)   SpO2 99%   BMI 44.26 kg/m , BMI Body mass index is 44.26 kg/m.  Wt Readings from Last 3 Encounters:  10/24/19 266 lb (120.7 kg)  08/09/19 253 lb (114.8 kg)  07/26/19 253 lb (114.8 kg)    General: Patient appears comfortable at rest. HEENT: Conjunctiva and lids normal, wearing a mask. Neck: Supple, no elevated JVP or carotid bruits, no thyromegaly. Lungs: Clear to auscultation, nonlabored breathing at rest. Cardiac: Regular rate and rhythm, no S3 or significant systolic murmur. Extremities: No pitting edema, distal pulses 2+.  ECG:  An ECG dated 05/04/2019 was personally reviewed today and demonstrated:  Sinus rhythm with PAC.  Recent Labwork: 12/07/2018: ALT 30; AST 27; BUN 11; Creatinine, Ser 0.86; Hemoglobin 13.8; Platelets 434; Potassium 4.2; Sodium 137 05/13/2019: TSH 0.76   Other Studies Reviewed Today:  Echocardiogram 10/08/2017: - Left ventricle: The cavity size was normal. Wall thickness was  normal. Systolic function was normal. The estimated ejection  fraction was in the range of 60% to 65%. Wall motion was normal;  there were no regional wall motion abnormalities. Left  ventricular diastolic function parameters were normal.  - Aortic valve: Valve area (VTI): 1.81 cm^2. Valve  area (Vmax):  1.76 cm^2. Valve area (Vmean): 1.75 cm^2.  - Atrial septum: No defect or patent foramen ovale was identified.   Cardiac monitor September 2019:  Sinus rhythm and sinus tachycardia with frequent PACs and isolated PVCs. Average heart rate 103 bpm.  Symptoms primarily correlated with sinus tachycardia and PACs.  Assessment and Plan:  History of recurring palpitations with documented PACs by cardiac monitor in 2019.  LVEF was normal at 60 to 65%  at that time as well.  She has had no associated syncope.  Agree with increase in Lopressor to 50 mg twice daily for now.  If this is not effective, next step would be switching to Toprol-XL 100 mg daily and titrating from there.  We might even consider remonitoring if symptoms escalate.  Follow-up scheduled.  Medication Adjustments/Labs and Tests Ordered: Current medicines are reviewed at length with the patient today.  Concerns regarding medicines are outlined above.   Tests Ordered: No orders of the defined types were placed in this encounter.   Medication Changes: No orders of the defined types were placed in this encounter.   Disposition:  Follow up 2 months with Jonni Sanger in the [place] office.  Signed, Satira Sark, MD, [facility] 10/24/2019 3:52 PM    [facility] [place], [place], [place] [address] Phone: [phone]; Fax: [phone]

## 2019-10-24 NOTE — Patient Instructions (Signed)
Your physician recommends that you schedule a follow-up appointment in: [place], NP  Your physician has recommended you make the following change in your medication:   TAKE LOPRESSOR 50 MG TWICE DAILY   Thank you for choosing [facility]!!

## 2019-12-27 ENCOUNTER — Ambulatory Visit: Payer: Commercial Managed Care - PPO | Admitting: Family Medicine

## 2019-12-28 ENCOUNTER — Ambulatory Visit: Payer: Commercial Managed Care - PPO | Admitting: Family Medicine

## 2020-01-05 NOTE — Progress Notes (Signed)
Cardiology Office Note  Date: 01/06/2020   ID: [name], DOB Aug 15, 1975, MRN [id number]  PCP:  Sharilyn Sites, MD  Cardiologist:  Rozann Lesches, MD Electrophysiologist:  None   Chief Complaint: Follow-up palpitations  History of Present Illness: Kathleen Stokes is a 44 y.o. female with a history of palpitations.  Last encounter with Dr. Domenic Polite 10/24/2019.  History of palpitations with documented PACs by cardiac monitor and treated with beta-blocker therapy.  Reported intermittent symptoms precipitated by anxiety or stressful situations.  She had increased her Lopressor to 50 mg twice daily within the prior few days before her visit and was feeling somewhat better.  Dr. Domenic Polite agreed with increasing the Lopressor to 50 mg twice daily.  He stated that this was not affected with be switching to Toprol-XL 100 mg daily.  He would consider remonitoring if symptoms escalated.   Patient is back for follow-up today.  She states in the interim since last visit she had upped her dose of her metoprolol due to increasing palpitations.  She states that palpitations seem to help increased since last visit and are more bothersome usually more prevalent during the evening when she is attempting to sleep.  She denies any associated orthostatic symptoms i.e. lightheadedness, dizziness, presyncopal or syncopal episodes.  Denies any shortness of breath when they occur.  She states she does snore but has not noticed any apneic periods nor has anyone else noticed her having apneic periods.  She denies any excessive daytime sleepiness/hypersomnia.   Past Medical History:  Diagnosis Date  . Asthma   . GERD (gastroesophageal reflux disease)   . Palpitations   . Prediabetes     Past Surgical History:  Procedure Laterality Date  . CATARACT EXTRACTION Left   . CHOLECYSTECTOMY N/A 12/08/2018   Procedure: LAPAROSCOPIC CHOLECYSTECTOMY;  Surgeon: Virl Cagey, MD;  Location: [facility];  Service:  General;  Laterality: N/A;    Current Outpatient Medications  Medication Sig Dispense Refill  . albuterol (PROVENTIL HFA;VENTOLIN HFA) 108 (90 Base) MCG/ACT inhaler Inhale 2 puffs into the lungs every 6 (six) hours as needed for wheezing or shortness of breath. 1 Inhaler 0  . atorvastatin (LIPITOR) 10 MG tablet Take 1 tablet (10 mg total) by mouth daily. 90 tablet 3  . esomeprazole (NEXIUM) 20 MG capsule Take 40 mg by mouth daily.     . fluticasone (FLONASE) 50 MCG/ACT nasal spray Place 1 spray into both nostrils daily.     Marland Kitchen METFORMIN HCL PO Take 500 mg by mouth in the morning and at bedtime.    . metoprolol succinate (TOPROL-XL) 50 MG 24 hr tablet Take 1 tablet (50 mg total) by mouth daily. Take with or immediately following a meal. 90 tablet 1   No current facility-administered medications for this visit.   Allergies:  Penicillins   Social History: The patient  reports that she has never smoked. She has never used smokeless tobacco. She reports previous alcohol use. She reports that she does not use drugs.   Family History: The patient's family history includes Gallbladder disease in her mother; Heart Problems in her mother.   ROS:  Please see the history of present illness. Otherwise, complete review of systems is positive for none.  All other systems are reviewed and negative.   Physical Exam: VS:  BP 104/64   Pulse 88   Ht 5\' 6"  (1.676 m)   Wt 273 lb (123.8 kg)   BMI 44.06 kg/m , BMI Body mass index is  44.06 kg/m.  Wt Readings from Last 3 Encounters:  01/06/20 273 lb (123.8 kg)  10/24/19 266 lb (120.7 kg)  08/09/19 253 lb (114.8 kg)    General: Morbidly obese patient appears comfortable at rest. Neck: Supple, no elevated JVP or carotid bruits, no thyromegaly. Lungs: Clear to auscultation, nonlabored breathing at rest. Cardiac: Regular rate and rhythm, no S3 or significant systolic murmur, no pericardial rub. Extremities: No pitting edema, distal pulses 2+. Skin: Warm  and dry. Musculoskeletal: No kyphosis. Neuropsychiatric: Alert and oriented x3, affect grossly appropriate.  ECG:    Recent Labwork: 05/13/2019: TSH 0.76  No results found for: CHOL, TRIG, HDL, CHOLHDL, VLDL, LDLCALC, LDLDIRECT  Other Studies Reviewed Today:   Echocardiogram 10/08/2017: - Left ventricle: The cavity size was normal. Wall thickness was  normal. Systolic function was normal. The estimated ejection  fraction was in the range of 60% to 65%. Wall motion was normal;  there were no regional wall motion abnormalities. Left  ventricular diastolic function parameters were normal.  - Aortic valve: Valve area (VTI): 1.81 cm^2. Valve area (Vmax):  1.76 cm^2. Valve area (Vmean): 1.75 cm^2.  - Atrial septum: No defect or patent foramen ovale was identified.   Cardiac monitor September 2019:  Sinus rhythm and sinus tachycardia with frequent PACs and isolated PVCs. Average heart rate 103 bpm.  Symptoms primarily correlated with sinus tachycardia and PACs.   Assessment and Plan:  1. Palpitations   2. Suspected sleep apnea    1. Palpitations She states in the interim since last visit she is increased her metoprolol to 75 mg p.o. twice daily due to increasing palpitations which have become more bothersome and more frequent.  Usually notices some more at night versus the daytime.  Stop metoprolol and start Toprol-XL 50 mg daily.  Suggested a repeat monitor to reassess palpitations.  Patient wants to defer for now   2.  Suspected sleep apnea States she does snore.  She denies anyone noticing apneic episodes.  She states she does not notice any daytime hypersomnia / excessive daytime sleepiness.  Body habitus/morphology appears conducive to sleep apnea.  Please refer for sleep study.  Medication Adjustments/Labs and Tests Ordered: Current medicines are reviewed at length with the patient today.  Concerns regarding medicines are outlined above.   Disposition: Follow-up  with Dr. Domenic Polite or [facility]  Signed, Levell July, NP 01/06/2020 10:34 AM    [facility] [place], [place], [place] [address] Phone: [phone]; Fax: [phone]

## 2020-01-06 ENCOUNTER — Ambulatory Visit (INDEPENDENT_AMBULATORY_CARE_PROVIDER_SITE_OTHER): Payer: Commercial Managed Care - PPO | Admitting: Family Medicine

## 2020-01-06 ENCOUNTER — Ambulatory Visit: Payer: Commercial Managed Care - PPO | Admitting: Cardiology

## 2020-01-06 ENCOUNTER — Encounter: Payer: Self-pay | Admitting: Family Medicine

## 2020-01-06 VITALS — BP 104/64 | HR 88 | Ht 66.0 in | Wt 273.0 lb

## 2020-01-06 DIAGNOSIS — R29818 Other symptoms and signs involving the nervous system: Secondary | ICD-10-CM | POA: Diagnosis not present

## 2020-01-06 DIAGNOSIS — R002 Palpitations: Secondary | ICD-10-CM

## 2020-01-06 MED ORDER — METOPROLOL SUCCINATE ER 50 MG PO TB24: 50.0000 mg | ORAL_TABLET | Freq: Every day | ORAL | 1 refills | Status: DC

## 2020-01-06 NOTE — Patient Instructions (Addendum)
Your physician recommends that you schedule a follow-up appointment in: [place], NP  Your physician has recommended you make the following change in your medication:   STOP LOPRESSOR  START TOPROL XL 50 MG DAILY   You have been referred to SLEEP STUDY/PULMONOLOGY  ZIO MONITOR FOR 14 DAYS  Thank you for choosing [facility]!!

## 2020-01-12 ENCOUNTER — Ambulatory Visit: Payer: Commercial Managed Care - PPO | Attending: Internal Medicine

## 2020-01-12 DIAGNOSIS — Z23 Encounter for immunization: Secondary | ICD-10-CM

## 2020-01-12 NOTE — Progress Notes (Signed)
   Covid-19 Vaccination Clinic  Name:  Kathleen Stokes    MRN: [id number] DOB: [birth year]  01/12/2020  Kathleen Stokes was observed post Covid-19 immunization for 15 minutes without incident. She was provided with Vaccine Information Sheet and instruction to access the V-Safe system.   Kathleen Stokes was instructed to call 911 with any severe reactions post vaccine: Marland Kitchen Difficulty breathing  . Swelling of face and throat  . A fast heartbeat  . A bad rash all over body  . Dizziness and weakness   Immunizations Administered    Name Date Dose VIS Date Route   JANSSEN COVID-19 VACCINE 01/12/2020  1:12 PM 0.5 mL 11/23/2019 Intramuscular   Manufacturer: [employer]   Lot: [id number]   [place]: [phone]

## 2020-02-02 ENCOUNTER — Telehealth: Payer: Self-pay | Admitting: *Deleted

## 2020-02-02 NOTE — Telephone Encounter (Signed)
Detailed msg left on voicemail

## 2020-02-02 NOTE — Telephone Encounter (Signed)
Everardo All, I agree with your assessment.  If this does recur she needs to go to the emergency room.  Thank you

## 2020-02-02 NOTE — Telephone Encounter (Signed)
Spoke with Ottis Stain pt's mother. She states that pt is having increased palpitations. She states pt is at work and unable to talk at this time d/t being at work. Mother will get in touch with pt and have her call office.

## 2020-02-02 NOTE — Telephone Encounter (Signed)
Spoke with pt who states that she experienced SOB, chest tightness and palpitations over the weekend. Zio monitor results are not available. Pt informed that if SOB and chest tightness reoccurs to be seen in the ER. Please advise.

## 2020-02-04 ENCOUNTER — Emergency Department (HOSPITAL_COMMUNITY): Payer: Commercial Managed Care - PPO

## 2020-02-04 ENCOUNTER — Encounter (HOSPITAL_COMMUNITY): Payer: Self-pay | Admitting: Emergency Medicine

## 2020-02-04 ENCOUNTER — Other Ambulatory Visit: Payer: Self-pay

## 2020-02-04 ENCOUNTER — Observation Stay (HOSPITAL_COMMUNITY)
Admission: EM | Admit: 2020-02-04 | Discharge: 2020-02-05 | Disposition: A | Discharge status: Home / Self Care | Payer: Commercial Managed Care - PPO | Attending: Family Medicine | Admitting: Family Medicine

## 2020-02-04 DIAGNOSIS — J45909 Unspecified asthma, uncomplicated: Secondary | ICD-10-CM | POA: Diagnosis not present

## 2020-02-04 DIAGNOSIS — K219 Gastro-esophageal reflux disease without esophagitis: Secondary | ICD-10-CM | POA: Diagnosis not present

## 2020-02-04 DIAGNOSIS — Z20822 Contact with and (suspected) exposure to covid-19: Secondary | ICD-10-CM | POA: Diagnosis not present

## 2020-02-04 DIAGNOSIS — R7989 Other specified abnormal findings of blood chemistry: Secondary | ICD-10-CM

## 2020-02-04 DIAGNOSIS — R7303 Prediabetes: Secondary | ICD-10-CM | POA: Diagnosis not present

## 2020-02-04 DIAGNOSIS — Z7984 Long term (current) use of oral hypoglycemic drugs: Secondary | ICD-10-CM | POA: Diagnosis not present

## 2020-02-04 DIAGNOSIS — R1013 Epigastric pain: Secondary | ICD-10-CM | POA: Diagnosis present

## 2020-02-04 DIAGNOSIS — K859 Acute pancreatitis without necrosis or infection, unspecified: Principal | ICD-10-CM | POA: Diagnosis present

## 2020-02-04 LAB — COMPREHENSIVE METABOLIC PANEL
ALT: 109 U/L — ABNORMAL HIGH (ref 0–44)
AST: 42 U/L — ABNORMAL HIGH (ref 15–41)
Albumin: 3.5 g/dL (ref 3.5–5.0)
Alkaline Phosphatase: 200 U/L — ABNORMAL HIGH (ref 38–126)
Anion gap: 8 (ref 5–15)
BUN: 9 mg/dL (ref 6–20)
CO2: 20 mmol/L — ABNORMAL LOW (ref 22–32)
Calcium: 8.5 mg/dL — ABNORMAL LOW (ref 8.9–10.3)
Chloride: 105 mmol/L (ref 98–111)
Creatinine, Ser: 0.77 mg/dL (ref 0.44–1.00)
GFR, Estimated: >60 mL/min (ref >60)
Glucose, Bld: 115 mg/dL — ABNORMAL HIGH (ref 70–99)
Potassium: 3.9 mmol/L (ref 3.5–5.1)
Sodium: 133 mmol/L — ABNORMAL LOW (ref 135–145)
Total Bilirubin: 1.1 mg/dL (ref 0.3–1.2)
Total Protein: 8 g/dL (ref 6.5–8.1)

## 2020-02-04 LAB — RESP PANEL BY RT-PCR (FLU A&B, COVID) ARPGX2
Influenza A by PCR: NEGATIVE
Influenza B by PCR: NEGATIVE
SARS Coronavirus 2 by RT PCR: NEGATIVE

## 2020-02-04 LAB — URINALYSIS, ROUTINE W REFLEX MICROSCOPIC
Bilirubin Urine: NEGATIVE
Glucose, UA: >=500 mg/dL — AB
Ketones, ur: NEGATIVE mg/dL
Leukocytes,Ua: NEGATIVE
Nitrite: NEGATIVE
Protein, ur: 30 mg/dL — AB
Specific Gravity, Urine: 1.02 (ref 1.005–1.030)
pH: 5 (ref 5.0–8.0)

## 2020-02-04 LAB — CBC
HCT: 41.2 % (ref 36.0–46.0)
Hemoglobin: 13.3 g/dL (ref 12.0–15.0)
MCH: 27.1 pg (ref 26.0–34.0)
MCHC: 32.3 g/dL (ref 30.0–36.0)
MCV: 83.9 fL (ref 80.0–100.0)
Platelets: 416 10*3/uL — ABNORMAL HIGH (ref 150–400)
RBC: 4.91 MIL/uL (ref 3.87–5.11)
RDW: 15.1 % (ref 11.5–15.5)
WBC: 21.1 10*3/uL — ABNORMAL HIGH (ref 4.0–10.5)
nRBC: 0 % (ref 0.0–0.2)

## 2020-02-04 LAB — GLUCOSE, CAPILLARY: Glucose-Capillary: 107 mg/dL — ABNORMAL HIGH (ref 70–99)

## 2020-02-04 LAB — TROPONIN I (HIGH SENSITIVITY)
Troponin I (High Sensitivity): <2 ng/L (ref <18)
Troponin I (High Sensitivity): 3 ng/L (ref <18)

## 2020-02-04 LAB — PREGNANCY, URINE: Preg Test, Ur: NEGATIVE

## 2020-02-04 LAB — LIPASE, BLOOD: Lipase: 158 U/L — ABNORMAL HIGH (ref 11–51)

## 2020-02-04 MED ORDER — SODIUM CHLORIDE 0.9 % IV BOLUS
1000.0000 mL | Freq: Once | INTRAVENOUS | Status: AC
  Administered 2020-02-04: 1000 mL via INTRAVENOUS

## 2020-02-04 MED ORDER — ALBUTEROL SULFATE HFA 108 (90 BASE) MCG/ACT IN AERS: 2.0000 | INHALATION_SPRAY | Freq: Four times a day (QID) | RESPIRATORY_TRACT | Status: DC | PRN

## 2020-02-04 MED ORDER — KETOROLAC TROMETHAMINE 30 MG/ML IJ SOLN: 30.0000 mg | Freq: Four times a day (QID) | INTRAMUSCULAR | Status: DC | PRN

## 2020-02-04 MED ORDER — INSULIN ASPART 100 UNIT/ML ~~LOC~~ SOLN
0.0000 [IU] | Freq: Four times a day (QID) | SUBCUTANEOUS | Status: DC
  Administered 2020-02-05: 3 [IU] via SUBCUTANEOUS

## 2020-02-04 MED ORDER — MORPHINE SULFATE (PF) 4 MG/ML IV SOLN
4.0000 mg | Freq: Once | INTRAVENOUS | Status: AC
  Administered 2020-02-04: 4 mg via INTRAVENOUS
  Filled 2020-02-04: qty 1

## 2020-02-04 MED ORDER — FLUTICASONE PROPIONATE 50 MCG/ACT NA SUSP
1.0000 | Freq: Every day | NASAL | Status: DC
  Administered 2020-02-05: 1 via NASAL
  Filled 2020-02-04: qty 16

## 2020-02-04 MED ORDER — PANTOPRAZOLE SODIUM 40 MG IV SOLR
40.0000 mg | INTRAVENOUS | Status: DC
  Administered 2020-02-04: 40 mg via INTRAVENOUS
  Filled 2020-02-04: qty 40

## 2020-02-04 MED ORDER — METOPROLOL SUCCINATE ER 50 MG PO TB24
50.0000 mg | ORAL_TABLET | Freq: Every day | ORAL | Status: DC
Start: 2020-02-05 — End: 2020-02-05
  Administered 2020-02-05: 50 mg via ORAL
  Filled 2020-02-04: qty 1

## 2020-02-04 MED ORDER — ENOXAPARIN SODIUM 60 MG/0.6ML ~~LOC~~ SOLN
60.0000 mg | SUBCUTANEOUS | Status: DC
  Administered 2020-02-04: 60 mg via SUBCUTANEOUS
  Filled 2020-02-04: qty 0.6

## 2020-02-04 MED ORDER — ONDANSETRON HCL 4 MG PO TABS: 4.0000 mg | ORAL_TABLET | Freq: Four times a day (QID) | ORAL | Status: DC | PRN

## 2020-02-04 MED ORDER — ONDANSETRON HCL 4 MG/2ML IJ SOLN
4.0000 mg | Freq: Four times a day (QID) | INTRAMUSCULAR | Status: DC | PRN
  Administered 2020-02-04: 4 mg via INTRAVENOUS
  Filled 2020-02-04: qty 2

## 2020-02-04 MED ORDER — FAMOTIDINE IN NACL 20-0.9 MG/50ML-% IV SOLN
20.0000 mg | Freq: Once | INTRAVENOUS | Status: AC
  Administered 2020-02-04: 20 mg via INTRAVENOUS
  Filled 2020-02-04: qty 50

## 2020-02-04 MED ORDER — IOHEXOL 300 MG/ML  SOLN
100.0000 mL | Freq: Once | INTRAMUSCULAR | Status: AC | PRN
  Administered 2020-02-04: 100 mL via INTRAVENOUS

## 2020-02-04 MED ORDER — ATORVASTATIN CALCIUM 10 MG PO TABS
10.0000 mg | ORAL_TABLET | Freq: Every day | ORAL | Status: DC
  Administered 2020-02-04 – 2020-02-05 (×2): 10 mg via ORAL
  Filled 2020-02-04 (×2): qty 1

## 2020-02-04 MED ORDER — LACTATED RINGERS IV SOLN: INTRAVENOUS | Status: DC

## 2020-02-04 MED ORDER — ONDANSETRON HCL 4 MG/2ML IJ SOLN
4.0000 mg | Freq: Once | INTRAMUSCULAR | Status: AC
  Administered 2020-02-04: 4 mg via INTRAVENOUS
  Filled 2020-02-04: qty 2

## 2020-02-04 NOTE — H&P (Signed)
History and Physical  Kathleen Stokes DUK:[id number] DOB: [birth year] DOA: 02/04/2020  Referring physician: Onalee Hua, PA-C, EDP PCP: Sharilyn Sites, MD  Outpatient Specialists:   Patient Coming From: home  Chief Complaint: epigastric abdominal pain  HPI: Kathleen Stokes is a 45 y.o. female with a history of asthma, GERD, prediabetes on Metformin for the past 3 to 4 months.  She presents with abdominal pain since Thursday.  Her pain is been increasing since that time.  Initially, she thought she had food poisoning and forced herself to vomit, but this did not improve her abdominal pain.  She has attempted to eat and has had increasing pain.  Pain is located in her epigastric region and into her left upper quadrant with radiation into her back.  Denies fevers, chills.  She went to the urgent care earlier today and was directed to come here.  No other palliating or provoking factors. Patient denies alcohol use. No new medications.  Emergency Department Course: Lipase elevated to 1 AST is 42 ALT is 109.  Alk phos is slightly elevated at 200.  White count 21.  Review of Systems:   Pt denies any fevers, chills, diarrhea, constipation, shortness of breath, dyspnea on exertion, orthopnea, cough, wheezing, palpitations, headache, vision changes, lightheadedness, dizziness, melena, rectal bleeding.  Review of systems are otherwise negative  Past Medical History:  Diagnosis Date  . Asthma   . GERD (gastroesophageal reflux disease)   . Palpitations   . Prediabetes    Past Surgical History:  Procedure Laterality Date  . CATARACT EXTRACTION Left   . CHOLECYSTECTOMY N/A 12/08/2018   Procedure: LAPAROSCOPIC CHOLECYSTECTOMY;  Surgeon: Virl Cagey, MD;  Location: [facility];  Service: General;  Laterality: N/A;   Social History:  reports that she has never smoked. She has never used smokeless tobacco. She reports previous alcohol use. She reports that she does not use drugs. Patient lives at  home  Allergies  Allergen Reactions  . Penicillins Rash    Did it involve swelling of the face/tongue/throat, SOB, or low BP? No Did it involve sudden or severe rash/hives, skin peeling, or any reaction on the inside of your mouth or nose? No Did you need to seek medical attention at a hospital or doctor's office? No When did it last happen?20 years If all above answers are "NO", may proceed with cephalosporin use.    Family History  Problem Relation Age of Onset  . Heart Problems Mother   . Gallbladder disease Mother       Prior to Admission medications   Medication Sig Start Date End Date Taking? Authorizing Provider  albuterol (PROVENTIL HFA;VENTOLIN HFA) 108 (90 Base) MCG/ACT inhaler Inhale 2 puffs into the lungs every 6 (six) hours as needed for wheezing or shortness of breath. 05/11/18  Yes Herminio Commons, MD  atorvastatin (LIPITOR) 10 MG tablet Take 1 tablet (10 mg total) by mouth daily. 05/05/19 01/06/20 Yes Verta Ellen., NP  esomeprazole (NEXIUM) 20 MG capsule Take 40 mg by mouth daily.    Yes [provider]  fluticasone (FLONASE) 50 MCG/ACT nasal spray Place 1 spray into both nostrils daily.    Yes [provider]  metFORMIN (GLUCOPHAGE) 500 MG tablet Take 500 mg by mouth 2 (two) times daily. 12/06/19  Yes [provider]  metoprolol succinate (TOPROL-XL) 50 MG 24 hr tablet Take 1 tablet (50 mg total) by mouth daily. Take with or immediately following a meal. 01/06/20 04/05/20 Yes Verta Ellen., NP  Physical Exam: BP (!) 98/57   Pulse 92   Temp 98.5 F (36.9 C) (Oral)   Resp 17   Ht 5' 5"  (1.651 m)   Wt 125.2 kg   LMP  (LMP Unknown)   SpO2 99%   BMI 45.93 kg/m   . General: Middle-age female. Awake and alert and oriented x3. No acute cardiopulmonary distress.  Marland Kitchen HEENT: Normocephalic atraumatic.  Right and left ears normal in appearance.  Pupils equal, round, reactive to light. Extraocular muscles are intact. Sclerae  anicteric and noninjected.  Moist mucosal membranes. No mucosal lesions.  . Neck: Neck supple without lymphadenopathy. No carotid bruits. No masses palpated.  . Cardiovascular: Regular rate with normal S1-S2 sounds. No murmurs, rubs, gallops auscultated. No JVD.  Marland Kitchen Respiratory: Good respiratory effort with no wheezes, rales, rhonchi. Lungs clear to auscultation bilaterally.  No accessory muscle use. . Abdomen: Soft, tender in the epigastrium and left upper quadrant, nondistended. Active bowel sounds. No masses or hepatosplenomegaly  . Skin: No rashes, lesions, or ulcerations.  Dry, warm to touch. 2+ dorsalis pedis and radial pulses. . Musculoskeletal: No calf or leg pain. All major joints not erythematous nontender.  No upper or lower joint deformation.  Good ROM.  No contractures  . Psychiatric: Intact judgment and insight. Pleasant and cooperative. . Neurologic: No focal neurological deficits. Strength is 5/5 and symmetric in upper and lower extremities.  Cranial nerves II through XII are grossly intact.           Labs on Admission: I have personally reviewed following labs and imaging studies  CBC: Recent Labs  Lab 02/04/20 0922  WBC 21.1*  HGB 13.3  HCT 41.2  MCV 83.9  PLT 160*   Basic Metabolic Panel: Recent Labs  Lab 02/04/20 0922  NA 133*  K 3.9  CL 105  CO2 20*  GLUCOSE 115*  BUN 9  CREATININE 0.77  CALCIUM 8.5*   GFR: Estimated Creatinine Clearance: 116.9 mL/min (by C-G formula based on SCr of 0.77 mg/dL). Liver Function Tests: Recent Labs  Lab 02/04/20 0922  AST 42*  ALT 109*  ALKPHOS 200*  BILITOT 1.1  PROT 8.0  ALBUMIN 3.5   Recent Labs  Lab 02/04/20 0922  LIPASE 158*   No results for input(s): AMMONIA in the last 168 hours. Coagulation Profile: No results for input(s): INR, PROTIME in the last 168 hours. Cardiac Enzymes: No results for input(s): CKTOTAL, CKMB, CKMBINDEX, TROPONINI in the last 168 hours. BNP (last 3 results) No results for  input(s): PROBNP in the last 8760 hours. HbA1C: No results for input(s): HGBA1C in the last 72 hours. CBG: No results for input(s): GLUCAP in the last 168 hours. Lipid Profile: No results for input(s): CHOL, HDL, LDLCALC, TRIG, CHOLHDL, LDLDIRECT in the last 72 hours. Thyroid Function Tests: No results for input(s): TSH, T4TOTAL, FREET4, T3FREE, THYROIDAB in the last 72 hours. Anemia Panel: No results for input(s): VITAMINB12, FOLATE, FERRITIN, TIBC, IRON, RETICCTPCT in the last 72 hours. Urine analysis:    Component Value Date/Time   COLORURINE YELLOW 02/04/2020 0922   APPEARANCEUR HAZY (A) 02/04/2020 0922   LABSPEC 1.020 02/04/2020 0922   PHURINE 5.0 02/04/2020 0922   GLUCOSEU >=500 (A) 02/04/2020 0922   HGBUR MODERATE (A) 02/04/2020 0922   BILIRUBINUR NEGATIVE 02/04/2020 0922   KETONESUR NEGATIVE 02/04/2020 0922   PROTEINUR 30 (A) 02/04/2020 0922   NITRITE NEGATIVE 02/04/2020 0922   LEUKOCYTESUR NEGATIVE 02/04/2020 0922   Sepsis Labs: @LABRCNTIP (procalcitonin:4,lacticidven:4) )No results found for this  or any previous visit (from the past 240 hour(s)).   Radiological Exams on Admission: CT ABDOMEN PELVIS W CONTRAST  Result Date: 02/04/2020 CLINICAL DATA:  Centralized abdominal pain for 2 days EXAM: CT ABDOMEN AND PELVIS WITH CONTRAST TECHNIQUE: Multidetector CT imaging of the abdomen and pelvis was performed using the standard protocol following bolus administration of intravenous contrast. CONTRAST:  11m OMNIPAQUE IOHEXOL 300 MG/ML  SOLN COMPARISON:  None. FINDINGS: Lower chest: No acute abnormality. Hepatobiliary: Status post cholecystectomy. Mild intrahepatic biliary dilatation, likely postsurgical. No focal liver lesion identified. Pancreas: Mild peripancreatic fat stranding around the pancreatic head and body. Trace adjacent free fluid. No organized peripancreatic fluid collection. No parenchymal hypoenhancement. No ductal dilatation. Spleen: Normal in size without focal  abnormality. Adrenals/Urinary Tract: Subcentimeter right adrenal gland nodule, too small to definitively characterize. Kidneys are within normal limits. No renal lesion, stone, or hydronephrosis. Urinary bladder is unremarkable. Stomach/Bowel: Small hiatal hernia. Stomach otherwise unremarkable. No dilated loops of bowel. Scattered colonic diverticulosis. No focal bowel wall thickening or inflammatory changes. Vascular/Lymphatic: No significant vascular findings are identified. There are numerous nonenlarged mesenteric and retroperitoneal lymph nodes, likely reactive. Reproductive: IUD present within the endometrial canal. No adnexal masses. Other: Small volume free fluid within the pelvis. No organized abdominopelvic fluid collection. No pneumoperitoneum. Musculoskeletal: No acute or significant osseous findings. IMPRESSION: 1. Peripancreatic fat stranding and trace fluid around the pancreatic head and body suggestive of acute pancreatitis. No organized peripancreatic fluid collection. 2. Small volume free fluid within the pelvis, likely reactive. 3. Status post cholecystectomy with mild intrahepatic biliary dilatation, likely postsurgical. 4. Small hiatal hernia. 5. Scattered colonic diverticulosis without evidence of acute diverticulitis. Electronically Signed   By: NDavina PokeD.O.   On: 02/04/2020 11:56   DG Chest Port 1 View  Result Date: 02/04/2020 CLINICAL DATA:  Shortness of breath and chest pain for 2 weeks. EXAM: PORTABLE CHEST 1 VIEW COMPARISON:  None. FINDINGS: The heart size and mediastinal contours are within normal limits. Mild atelectasis of left lung base is noted. Both lungs are otherwise clear. The visualized skeletal structures are unremarkable. IMPRESSION: Mild atelectasis of left lung base.  No focal pneumonia. Electronically Signed   By: WAbelardo DieselM.D.   On: 02/04/2020 10:26    EKG: Independently reviewed. Sinus tachycardia no acute ST changes  Assessment/Plan: Principal  Problem:   Acute pancreatitis Active Problems:   Asthma   GERD (gastroesophageal reflux disease)   Prediabetes   Elevated LFTs    This patient was discussed with the ED physician, including pertinent vitals, physical exam findings, labs, and imaging.  We also discussed care given by the ED provider.  1. Acute pancreatitis a. Observation b. GI consulted c. Continue IV fluids d. N.p.o. except ice chips and sips with meds e. Check lipase, CBC in the morning f. O quadrant ultrasound and surgery 2. Elevated LFTs a. Recheck in the morning b. Right upper quadrant ultrasound 3. GERD a. Protonix IV 4. Asthma 5. Prediabetes a. Hold Metformin b. SSI and CBGs  DVT prophylaxis: lovenox Consultants: GI Code Status: full code Family Communication: none  Disposition Plan: return home following improvement of pancreatitis   STruett Mainland DO

## 2020-02-04 NOTE — ED Triage Notes (Signed)
Pt c/o abdominal pain that began on 02/02/20.  Pt denies urinary symptoms.

## 2020-02-04 NOTE — ED Notes (Signed)
Pt c/o sob with activity x 2 weeks. Came in today due to all over abd pain. Certain positions make abd pain worse.Eating makes abd pain worse.  lnbm this am-denies black or bloody stools. Denies gu sx. Denies n/v/d. Pt walked to bathroom and came back with heavy breathing. Pt recovered with wnl breathing in 5 minutes after sitting down.

## 2020-02-04 NOTE — ED Provider Notes (Signed)
[facility] EMERGENCY DEPARTMENT Provider Note   CSN: [id number] Arrival date & time: 02/04/20  4235     History Chief Complaint  Patient presents with  . Abdominal Pain    Bryttney Netzer is a 45 y.o. female.  Tensley Wery is a 45 y.o. female with history of asthma, GERD, prediabetes and palpitations, who presents to the emergency department for evaluation of abdominal pain.  Patient reports abdominal pain began 3 days ago on 12/30.  She reports that she initially thought it was food poisoning, but had persistent pain across the upper abdomen that continued to worsen.  She reports associated nausea and feeling like she needs to vomit but has not been vomiting.  She has been having normal, nonbloody bowel movements.  Denies dysuria or urinary frequency.  She reports that 2 days prior to symptoms beginning she was having some intermittent chest pains and shortness of breath, but then when abdominal pain started the seem to go away and she was more focused on the pain in her abdomen.  No lower extremity swelling.  No fevers or cough.  No known sick contacts.  Initially went to urgent care but was sent here for further evaluation.  She took Nexium this morning, but has not taken any medications to treat symptoms today.  Denies alcohol or drug use.  Patient with palpitations that she has been seeing cardiology for, recently increased dose of Lopressor, but denies any new medication changes.  The history is provided by the patient.       Past Medical History:  Diagnosis Date  . Asthma   . GERD (gastroesophageal reflux disease)   . Palpitations   . Prediabetes     Patient Active Problem List   Diagnosis Date Noted  . Biliary dyskinesia 12/05/2018    Past Surgical History:  Procedure Laterality Date  . CATARACT EXTRACTION Left   . CHOLECYSTECTOMY N/A 12/08/2018   Procedure: LAPAROSCOPIC CHOLECYSTECTOMY;  Surgeon: Virl Cagey, MD;  Location: [facility];  Service: General;   Laterality: N/A;     OB History    Gravida  1   Para  1   Term  1   Preterm      AB      Living  1     SAB      IAB      Ectopic      Multiple      Live Births              Family History  Problem Relation Age of Onset  . Heart Problems Mother   . Gallbladder disease Mother     Social History   Tobacco Use  . Smoking status: Never Smoker  . Smokeless tobacco: Never Used  Vaping Use  . Vaping Use: Never used  Substance Use Topics  . Alcohol use: Not Currently  . Drug use: Never    Home Medications Prior to Admission medications   Medication Sig Start Date End Date Taking? Authorizing Provider  albuterol (PROVENTIL HFA;VENTOLIN HFA) 108 (90 Base) MCG/ACT inhaler Inhale 2 puffs into the lungs every 6 (six) hours as needed for wheezing or shortness of breath. 05/11/18   Herminio Commons, MD  atorvastatin (LIPITOR) 10 MG tablet Take 1 tablet (10 mg total) by mouth daily. 05/05/19 01/06/20  Verta Ellen., NP  esomeprazole (NEXIUM) 20 MG capsule Take 40 mg by mouth daily.     [provider]  fluticasone (FLONASE) 50 MCG/ACT nasal spray  Place 1 spray into both nostrils daily.     [provider]  METFORMIN HCL PO Take 500 mg by mouth in the morning and at bedtime.    [provider]  metoprolol succinate (TOPROL-XL) 50 MG 24 hr tablet Take 1 tablet (50 mg total) by mouth daily. Take with or immediately following a meal. 01/06/20 04/05/20  Verta Ellen., NP    Allergies    Penicillins  Review of Systems   Review of Systems  Constitutional: Positive for chills. Negative for fever.  HENT: Negative.   Respiratory: Positive for shortness of breath. Negative for cough.   Cardiovascular: Positive for chest pain. Negative for leg swelling.  Gastrointestinal: Positive for abdominal pain and nausea. Negative for blood in stool, constipation, diarrhea and vomiting.  Genitourinary: Negative for dysuria, flank pain, frequency,  hematuria, vaginal bleeding and vaginal discharge.  Musculoskeletal: Negative for arthralgias and myalgias.  Skin: Negative for color change and rash.  Neurological: Negative for dizziness, syncope and light-headedness.  All other systems reviewed and are negative.   Physical Exam Updated Vital Signs BP 123/67 (BP Location: Right Arm)   Pulse (!) 104   Temp 98.3 F (36.8 C) (Oral)   Resp 16   Ht _0  (1.651 m)   Wt 125.2 kg   LMP  (LMP Unknown)   SpO2 100%   BMI 45.93 kg/m   Physical Exam Vitals and nursing note reviewed.  Constitutional:      General: She is not in acute distress.    Appearance: She is well-developed and well-nourished. She is obese. She is not diaphoretic.     Comments: Alert, appears uncomfortable but is in no acute distress  HENT:     Head: Normocephalic and atraumatic.     Mouth/Throat:     Mouth: Oropharynx is clear and moist. Mucous membranes are moist.     Pharynx: Oropharynx is clear.  Eyes:     General:        Right eye: No discharge.        Left eye: No discharge.     Extraocular Movements: EOM normal.     Pupils: Pupils are equal, round, and reactive to light.  Cardiovascular:     Rate and Rhythm: Normal rate and regular rhythm.     Pulses: Intact distal pulses.     Heart sounds: Normal heart sounds. No murmur heard. No friction rub. No gallop.   Pulmonary:     Effort: Pulmonary effort is normal. No respiratory distress.     Breath sounds: Normal breath sounds. No wheezing or rales.     Comments: Respirations equal and unlabored, patient able to speak in full sentences, lungs clear to auscultation bilaterally  Abdominal:     General: Bowel sounds are normal. There is no distension.     Palpations: Abdomen is soft. There is no mass.     Tenderness: There is abdominal tenderness. There is no guarding.     Comments: Abdomen is soft, nondistended, bowel sounds present throughout, there is focal tenderness across the upper abdomen, most  severe in the epigastric and periumbilical regions, slight guarding  Musculoskeletal:        General: No deformity or edema.     Cervical back: Neck supple.     Right lower leg: No edema.     Left lower leg: No edema.  Skin:    General: Skin is warm and dry.     Capillary Refill: Capillary refill takes less than  2 seconds.  Neurological:     Mental Status: She is alert.     Coordination: Coordination normal.     Comments: Speech is clear, able to follow commands Moves extremities without ataxia, coordination intact  Psychiatric:        Mood and Affect: Mood normal.        Behavior: Behavior normal.     ED Results / Procedures / Treatments   Labs (all labs ordered are listed, but only abnormal results are displayed) Labs Reviewed  LIPASE, BLOOD - Abnormal; Notable for the following components:      Result Value   Lipase 158 (*)    All other components within normal limits  COMPREHENSIVE METABOLIC PANEL - Abnormal; Notable for the following components:   Sodium 133 (*)    CO2 20 (*)    Glucose, Bld 115 (*)    Calcium 8.5 (*)    AST 42 (*)    ALT 109 (*)    Alkaline Phosphatase 200 (*)    All other components within normal limits  CBC - Abnormal; Notable for the following components:   WBC 21.1 (*)    Platelets 416 (*)    All other components within normal limits  URINALYSIS, ROUTINE W REFLEX MICROSCOPIC - Abnormal; Notable for the following components:   APPearance HAZY (*)    Glucose, UA >=500 (*)    Hgb urine dipstick MODERATE (*)    Protein, ur 30 (*)    Bacteria, UA RARE (*)    Non Squamous Epithelial 0-5 (*)    All other components within normal limits  RESP PANEL BY RT-PCR (FLU A&B, COVID) ARPGX2  PREGNANCY, URINE  TROPONIN I (HIGH SENSITIVITY)  TROPONIN I (HIGH SENSITIVITY)    EKG None  Radiology CT ABDOMEN PELVIS W CONTRAST  Result Date: 02/04/2020 CLINICAL DATA:  Centralized abdominal pain for 2 days EXAM: CT ABDOMEN AND PELVIS WITH CONTRAST  TECHNIQUE: Multidetector CT imaging of the abdomen and pelvis was performed using the standard protocol following bolus administration of intravenous contrast. CONTRAST:  191m OMNIPAQUE IOHEXOL 300 MG/ML  SOLN COMPARISON:  None. FINDINGS: Lower chest: No acute abnormality. Hepatobiliary: Status post cholecystectomy. Mild intrahepatic biliary dilatation, likely postsurgical. No focal liver lesion identified. Pancreas: Mild peripancreatic fat stranding around the pancreatic head and body. Trace adjacent free fluid. No organized peripancreatic fluid collection. No parenchymal hypoenhancement. No ductal dilatation. Spleen: Normal in size without focal abnormality. Adrenals/Urinary Tract: Subcentimeter right adrenal gland nodule, too small to definitively characterize. Kidneys are within normal limits. No renal lesion, stone, or hydronephrosis. Urinary bladder is unremarkable. Stomach/Bowel: Small hiatal hernia. Stomach otherwise unremarkable. No dilated loops of bowel. Scattered colonic diverticulosis. No focal bowel wall thickening or inflammatory changes. Vascular/Lymphatic: No significant vascular findings are identified. There are numerous nonenlarged mesenteric and retroperitoneal lymph nodes, likely reactive. Reproductive: IUD present within the endometrial canal. No adnexal masses. Other: Small volume free fluid within the pelvis. No organized abdominopelvic fluid collection. No pneumoperitoneum. Musculoskeletal: No acute or significant osseous findings. IMPRESSION: 1. Peripancreatic fat stranding and trace fluid around the pancreatic head and body suggestive of acute pancreatitis. No organized peripancreatic fluid collection. 2. Small volume free fluid within the pelvis, likely reactive. 3. Status post cholecystectomy with mild intrahepatic biliary dilatation, likely postsurgical. 4. Small hiatal hernia. 5. Scattered colonic diverticulosis without evidence of acute diverticulitis. Electronically Signed   By:  NDavina PokeD.O.   On: 02/04/2020 11:56   DG Chest Port 1 View  Result Date: 02/04/2020  CLINICAL DATA:  Shortness of breath and chest pain for 2 weeks. EXAM: PORTABLE CHEST 1 VIEW COMPARISON:  None. FINDINGS: The heart size and mediastinal contours are within normal limits. Mild atelectasis of left lung base is noted. Both lungs are otherwise clear. The visualized skeletal structures are unremarkable. IMPRESSION: Mild atelectasis of left lung base.  No focal pneumonia. Electronically Signed   By: Abelardo Diesel M.D.   On: 02/04/2020 10:26    Procedures Procedures (including critical care time)  Medications Ordered in ED Medications  sodium chloride 0.9 % bolus 1,000 mL (0 mLs Intravenous Stopped 02/04/20 1140)  ondansetron (ZOFRAN) injection 4 mg (4 mg Intravenous Given 02/04/20 1006)  famotidine (PEPCID) IVPB 20 mg premix (0 mg Intravenous Stopped 02/04/20 1041)  morphine 4 MG/ML injection 4 mg (4 mg Intravenous Given 02/04/20 1006)  iohexol (OMNIPAQUE) 300 MG/ML solution 100 mL (100 mLs Intravenous Contrast Given 02/04/20 1135)  sodium chloride 0.9 % bolus 1,000 mL (1,000 mLs Intravenous New Bag/Given 02/04/20 1521)  morphine 4 MG/ML injection 4 mg (4 mg Intravenous Given 02/04/20 1527)    ED Course  I have reviewed the triage vital signs and the nursing notes.  Pertinent labs & imaging results that were available during my care of the patient were reviewed by me and considered in my medical decision making (see chart for details).    MDM Rules/Calculators/A&P                         45 year old female presents with 3 days of persistent upper abdominal pain with associated nausea, no history of similar symptoms.  On arrival patient is afebrile, mildly tachycardic but vitals otherwise normal.  Patient appears uncomfortable.  Tenderness primarily in the epigastric and periumbilical regions.  Will evaluate with abdominal labs and CT abdomen pelvis.  IV fluids, pain and nausea medication given.   Patient also reports that prior to abdominal pain beginning she was having some chest pain and shortness of breath seem to resolve, with upper abdominal pain will also check EKG, chest x-ray and troponins.  I have independently ordered, reviewed and interpreted all labs and imaging: CBC: Leukocytosis of 21, normal hemoglobin CMP: Mild hyponatremia, likely in the setting of dehydration, CO2 of 20, normal creatinine, LFTs are mildly elevated with AST of 109, ALT of 42 and alk phos of 200, normal bili Lipase: 158 UA: Small amount of white blood cells and bacteria noted but no other signs of infection and patient currently denies urinary symptoms. Pregnancy: Negative Troponin: Negative x2  EKG: Sinus tachycardia with no concerning ischemic changes  Chest x-ray with no focal pneumonia or other active cardiopulmonary disease.  CT abdomen pelvis with peripancreatic fat stranding and trace fluid around the pancreatic head and body suggestive of acute pancreatitis, no fluid collection or pseudocyst noted.  Small free fluid within the pelvis, likely reactive.  Patient is status post cholecystectomy, with mild intrahepatic biliary dilatation, which could be postsurgical.  Given intrahepatic biliary ductal dilatation concern this could be contributing to pancreatitis, patient does not have history of alcohol abuse to contribute to pancreatitis and denies any drug use.  No recent medication changes aside from an increase in her dose of metoprolol.  Unfortunately found out that ultrasound is not available after noon today.  Patient pain has been overall well controlled with morphine and IV fluids, she has been able to tolerate clear liquids but with clear liquids her pain has started to come back, no vomiting  currently.  I discussed case with Dr. Abbey Chatters with GI, he does feel that the patient needs right upper quadrant ultrasound, could potentially be done as an outpatient patient is feeling better, but would  also not be unreasonable to admit to medicine and obtain ultrasound in the morning.  I had shared decision-making discussion with patient regarding this and she would prefer to be observed overnight with ultrasound here in the hospital.  Will consult medicine for admission.  Case discussed with Dr. Nehemiah Settle with [facility] hospitalist who will see and admit the patient  Final Clinical Impression(s) / ED Diagnoses Final diagnoses:  Pancreatitis    Rx / DC Orders ED Discharge Orders    None       Janet Berlin 02/04/20 1627    Milton Ferguson, MD 02/09/20 1036

## 2020-02-04 NOTE — ED Notes (Signed)
Pt in bed, pt states that she had some pain in her iv in her L forearm and then when she looked over it had fallen out, dressing placed, pt has aprox 500 ml of ns still to infuse, replaced iv, pt now has a 20 gauge iv in her R ac, of NS up and infusing.

## 2020-02-04 NOTE — ED Notes (Signed)
Clear fluids given for po challenge.

## 2020-02-05 ENCOUNTER — Observation Stay (HOSPITAL_COMMUNITY): Payer: Commercial Managed Care - PPO

## 2020-02-05 DIAGNOSIS — K219 Gastro-esophageal reflux disease without esophagitis: Secondary | ICD-10-CM | POA: Diagnosis not present

## 2020-02-05 DIAGNOSIS — R7989 Other specified abnormal findings of blood chemistry: Secondary | ICD-10-CM

## 2020-02-05 DIAGNOSIS — K859 Acute pancreatitis without necrosis or infection, unspecified: Secondary | ICD-10-CM

## 2020-02-05 LAB — LIPASE, BLOOD: Lipase: 55 U/L — ABNORMAL HIGH (ref 11–51)

## 2020-02-05 LAB — GLUCOSE, CAPILLARY
Glucose-Capillary: 106 mg/dL — ABNORMAL HIGH (ref 70–99)
Glucose-Capillary: 115 mg/dL — ABNORMAL HIGH (ref 70–99)
Glucose-Capillary: 172 mg/dL — ABNORMAL HIGH (ref 70–99)

## 2020-02-05 LAB — COMPREHENSIVE METABOLIC PANEL
ALT: 62 U/L — ABNORMAL HIGH (ref 0–44)
AST: 20 U/L (ref 15–41)
Albumin: 2.9 g/dL — ABNORMAL LOW (ref 3.5–5.0)
Alkaline Phosphatase: 142 U/L — ABNORMAL HIGH (ref 38–126)
Anion gap: 4 — ABNORMAL LOW (ref 5–15)
BUN: 6 mg/dL (ref 6–20)
CO2: 23 mmol/L (ref 22–32)
Calcium: 7.7 mg/dL — ABNORMAL LOW (ref 8.9–10.3)
Chloride: 104 mmol/L (ref 98–111)
Creatinine, Ser: 0.67 mg/dL (ref 0.44–1.00)
GFR, Estimated: >60 mL/min (ref >60)
Glucose, Bld: 123 mg/dL — ABNORMAL HIGH (ref 70–99)
Potassium: 3.5 mmol/L (ref 3.5–5.1)
Sodium: 131 mmol/L — ABNORMAL LOW (ref 135–145)
Total Bilirubin: 0.9 mg/dL (ref 0.3–1.2)
Total Protein: 6.6 g/dL (ref 6.5–8.1)

## 2020-02-05 LAB — CBC
HCT: 36.5 % (ref 36.0–46.0)
Hemoglobin: 11.8 g/dL — ABNORMAL LOW (ref 12.0–15.0)
MCH: 27.1 pg (ref 26.0–34.0)
MCHC: 32.3 g/dL (ref 30.0–36.0)
MCV: 83.7 fL (ref 80.0–100.0)
Platelets: 315 10*3/uL (ref 150–400)
RBC: 4.36 MIL/uL (ref 3.87–5.11)
RDW: 15 % (ref 11.5–15.5)
WBC: 16.9 10*3/uL — ABNORMAL HIGH (ref 4.0–10.5)
nRBC: 0 % (ref 0.0–0.2)

## 2020-02-05 LAB — HEMOGLOBIN A1C
Hgb A1c MFr Bld: 6.1 % — ABNORMAL HIGH (ref 4.8–5.6)
Mean Plasma Glucose: 128.37 mg/dL

## 2020-02-05 LAB — HIV ANTIBODY (ROUTINE TESTING W REFLEX): HIV Screen 4th Generation wRfx: NONREACTIVE

## 2020-02-05 MED ORDER — METOPROLOL SUCCINATE ER 50 MG PO TB24: 50.0000 mg | ORAL_TABLET | Freq: Every day | ORAL | 2 refills | Status: DC

## 2020-02-05 MED ORDER — ATORVASTATIN CALCIUM 20 MG PO TABS: 20.0000 mg | ORAL_TABLET | Freq: Every day | ORAL | 3 refills | Status: AC

## 2020-02-05 MED ORDER — METFORMIN HCL 500 MG PO TABS: 500.0000 mg | ORAL_TABLET | Freq: Two times a day (BID) | ORAL | 2 refills | Status: DC

## 2020-02-05 MED ORDER — ACETAMINOPHEN 325 MG PO TABS: 650.0000 mg | ORAL_TABLET | Freq: Three times a day (TID) | ORAL | 0 refills | Status: DC | PRN

## 2020-02-05 MED ORDER — IBUPROFEN 100 MG PO CHEW
400.0000 mg | CHEWABLE_TABLET | Freq: Once | ORAL | Status: DC
  Filled 2020-02-05: qty 4

## 2020-02-05 MED ORDER — ACETAMINOPHEN 325 MG PO TABS
650.0000 mg | ORAL_TABLET | Freq: Four times a day (QID) | ORAL | Status: DC | PRN
  Administered 2020-02-05: 650 mg via ORAL
  Filled 2020-02-05: qty 2

## 2020-02-05 MED ORDER — TRAMADOL HCL 50 MG PO TABS: 50.0000 mg | ORAL_TABLET | Freq: Two times a day (BID) | ORAL | 0 refills | Status: AC | PRN

## 2020-02-05 MED ORDER — ESOMEPRAZOLE MAGNESIUM 40 MG PO CPDR: 40.0000 mg | DELAYED_RELEASE_CAPSULE | Freq: Every day | ORAL | 3 refills | Status: DC

## 2020-02-05 NOTE — Discharge Summary (Signed)
Kathleen Stokes, is a 45 y.o. female  DOB 1975/05/03  MRN [id number].  Admission date:  02/04/2020  Admitting Physician  Truett Mainland, DO  Discharge Date:  02/05/2020   Primary MD  Sharilyn Sites, MD  Recommendations for primary care physician for things to follow:   1) avoid greasy/fatty foods 2) call or return if worsening abdominal pain especially after meals  Admission Diagnosis  Acute pancreatitis [K85.90] Pancreatitis [K85.90]   Discharge Diagnosis  Acute pancreatitis [K85.90] Pancreatitis [K85.90]    Principal Problem:   Acute pancreatitis Active Problems:   Asthma   GERD (gastroesophageal reflux disease)   Prediabetes   Elevated LFTs      Past Medical History:  Diagnosis Date  . Asthma   . GERD (gastroesophageal reflux disease)   . Palpitations   . Prediabetes     Past Surgical History:  Procedure Laterality Date  . CATARACT EXTRACTION Left   . CHOLECYSTECTOMY N/A 12/08/2018   Procedure: LAPAROSCOPIC CHOLECYSTECTOMY;  Surgeon: Virl Cagey, MD;  Location: [facility];  Service: General;  Laterality: N/A;     HPI  from the history and physical done on the day of admission:   Patient Coming From: home  Chief Complaint: epigastric abdominal pain  HPI: Kathleen Stokes is a 45 y.o. female with a history of asthma, GERD, prediabetes on Metformin for the past 3 to 4 months.  She presents with abdominal pain since Thursday.  Her pain is been increasing since that time.  Initially, she thought she had food poisoning and forced herself to vomit, but this did not improve her abdominal pain.  She has attempted to eat and has had increasing pain.  Pain is located in her epigastric region and into her left upper quadrant with radiation into her back.  Denies fevers, chills.  She went to the urgent care earlier today and was directed to come here.  No other palliating or provoking factors.  Patient denies alcohol use. No new medications.  Emergency Department Course: Lipase elevated to 1 AST is 42 ALT is 109.  Alk phos is slightly elevated at 200.  White count 21.  Review of Systems:       Hospital Course:     1)Acute idiopathic Pancreatitis--- cannot rule out transient passage of stone, ---improved with bowel rest and IV fluids -Triglycerides were WNL in 10/2018 -Tolerating oral intake well -GI consult appreciated -Okay to discharge on PPI --Lipase is down to 55 from 158 -Abdomen and pelvis findings consistent with acute pancreatitis noted  2)Elevated LFTS--- abdominal ultrasound consistent with status post cholecystectomy with expected CBD dilatation -AST 42>>20 ALT 109 >>62 Alk Phos 200>>142 T Bill 1.1 >> 0.9  3)DM2-A1c 6.1, okay to continue Metformin  4)Hyponatremia--- due to dehydration, should improve with improving oral intake  Discharge Condition: stable  Follow UP----PCP for repeat LFTs in a couple of weeks     Consults obtained -   Diet and Activity recommendation:  As advised  Discharge Instructions  * Discharge Instructions  Call MD for:  difficulty breathing, headache or visual disturbances   Complete by: As directed    Call MD for:  persistant dizziness or light-headedness   Complete by: As directed    Call MD for:  persistant nausea and vomiting   Complete by: As directed    Call MD for:  severe uncontrolled pain   Complete by: As directed    Call MD for:  temperature >100.4   Complete by: As directed    Diet - low sodium heart healthy   Complete by: As directed    Discharge instructions   Complete by: As directed    1) avoid greasy/fatty foods 2) call or return if worsening abdominal pain especially after meals   Increase activity slowly   Complete by: As directed         Discharge Medications     Allergies as of 02/05/2020      Reactions   Penicillins Rash   Did it involve swelling of the face/tongue/throat, SOB,  or low BP? No Did it involve sudden or severe rash/hives, skin peeling, or any reaction on the inside of your mouth or nose? No Did you need to seek medical attention at a hospital or doctor's office? No When did it last happen?20 years If all above answers are "NO", may proceed with cephalosporin use.      Medication List    TAKE these medications   acetaminophen 325 MG tablet Commonly known as: TYLENOL Take 2 tablets (650 mg total) by mouth every 8 (eight) hours as needed for mild pain, fever or headache.   albuterol 108 (90 Base) MCG/ACT inhaler Commonly known as: VENTOLIN HFA Inhale 2 puffs into the lungs every 6 (six) hours as needed for wheezing or shortness of breath.   atorvastatin 20 MG tablet Commonly known as: LIPITOR Take 1 tablet (20 mg total) by mouth daily. For Cholesterol What changed:   medication strength  how much to take  additional instructions   esomeprazole 40 MG capsule Commonly known as: NEXIUM Take 1 capsule (40 mg total) by mouth daily. For Stomach What changed:   medication strength  additional instructions   fluticasone 50 MCG/ACT nasal spray Commonly known as: FLONASE Place 1 spray into both nostrils daily.   metFORMIN 500 MG tablet Commonly known as: GLUCOPHAGE Take 1 tablet (500 mg total) by mouth 2 (two) times daily.   metoprolol succinate 50 MG 24 hr tablet Commonly known as: TOPROL-XL Take 1 tablet (50 mg total) by mouth daily. Take with or immediately following a meal.   traMADol 50 MG tablet Commonly known as: Ultram Take 1 tablet (50 mg total) by mouth every 12 (twelve) hours as needed for up to 5 days.       Major procedures and Radiology Reports - PLEASE review detailed and final reports for all details, in brief -  CT ABDOMEN PELVIS W CONTRAST  Result Date: 02/04/2020 CLINICAL DATA:  Centralized abdominal pain for 2 days EXAM: CT ABDOMEN AND PELVIS WITH CONTRAST TECHNIQUE: Multidetector CT imaging of the  abdomen and pelvis was performed using the standard protocol following bolus administration of intravenous contrast. CONTRAST:  170m OMNIPAQUE IOHEXOL 300 MG/ML  SOLN COMPARISON:  None. FINDINGS: Lower chest: No acute abnormality. Hepatobiliary: Status post cholecystectomy. Mild intrahepatic biliary dilatation, likely postsurgical. No focal liver lesion identified. Pancreas: Mild peripancreatic fat stranding around the pancreatic head and body. Trace adjacent free fluid. No organized peripancreatic fluid collection. No parenchymal hypoenhancement. No ductal dilatation. Spleen:  Normal in size without focal abnormality. Adrenals/Urinary Tract: Subcentimeter right adrenal gland nodule, too small to definitively characterize. Kidneys are within normal limits. No renal lesion, stone, or hydronephrosis. Urinary bladder is unremarkable. Stomach/Bowel: Small hiatal hernia. Stomach otherwise unremarkable. No dilated loops of bowel. Scattered colonic diverticulosis. No focal bowel wall thickening or inflammatory changes. Vascular/Lymphatic: No significant vascular findings are identified. There are numerous nonenlarged mesenteric and retroperitoneal lymph nodes, likely reactive. Reproductive: IUD present within the endometrial canal. No adnexal masses. Other: Small volume free fluid within the pelvis. No organized abdominopelvic fluid collection. No pneumoperitoneum. Musculoskeletal: No acute or significant osseous findings. IMPRESSION: 1. Peripancreatic fat stranding and trace fluid around the pancreatic head and body suggestive of acute pancreatitis. No organized peripancreatic fluid collection. 2. Small volume free fluid within the pelvis, likely reactive. 3. Status post cholecystectomy with mild intrahepatic biliary dilatation, likely postsurgical. 4. Small hiatal hernia. 5. Scattered colonic diverticulosis without evidence of acute diverticulitis. Electronically Signed   By: Davina Poke D.O.   On: 02/04/2020 11:56    DG Chest Port 1 View  Result Date: 02/04/2020 CLINICAL DATA:  Shortness of breath and chest pain for 2 weeks. EXAM: PORTABLE CHEST 1 VIEW COMPARISON:  None. FINDINGS: The heart size and mediastinal contours are within normal limits. Mild atelectasis of left lung base is noted. Both lungs are otherwise clear. The visualized skeletal structures are unremarkable. IMPRESSION: Mild atelectasis of left lung base.  No focal pneumonia. Electronically Signed   By: Abelardo Diesel M.D.   On: 02/04/2020 10:26   US Abdomen Limited RUQ (LIVER/GB)  Result Date: 02/05/2020 CLINICAL DATA:  Epigastric pain EXAM: ULTRASOUND ABDOMEN LIMITED RIGHT UPPER QUADRANT COMPARISON:  CT 02/14/2020 FINDINGS: Gallbladder: No gallstones or wall thickening visualized. No sonographic Murphy sign noted by sonographer. Common bile duct: Diameter: Prominent, 9 mm, possibly related to post cholecystectomy state. Liver: No focal lesion identified. Within normal limits in parenchymal echogenicity. Portal vein is patent on color Doppler imaging with normal direction of blood flow towards the liver. Other: None. IMPRESSION: Prominent common bile duct following cholecystectomy. This may be related to post cholecystectomy dilatation, but recommend correlation with LFTs. Electronically Signed   By: Rolm Baptise M.D.   On: 02/05/2020 10:38    Micro Results    Recent Results (from the past 240 hour(s))  Resp Panel by RT-PCR (Flu A&B, Covid) Nasopharyngeal Swab     Status: None   Collection Time: 02/04/20  3:09 PM   Specimen: Nasopharyngeal Swab; Nasopharyngeal(NP) swabs in vial transport medium  Result Value Ref Range Status   SARS Coronavirus 2 by RT PCR NEGATIVE NEGATIVE Final    Comment: (NOTE) SARS-CoV-2 target nucleic acids are NOT DETECTED.  The SARS-CoV-2 RNA is generally detectable in upper respiratory specimens during the acute phase of infection. The lowest concentration of SARS-CoV-2 viral copies this assay can detect is 138  copies/mL. A negative result does not preclude SARS-Cov-2 infection and should not be used as the sole basis for treatment or other patient management decisions. A negative result may occur with  improper specimen collection/handling, submission of specimen other than nasopharyngeal swab, presence of viral mutation(s) within the areas targeted by this assay, and inadequate number of viral copies(<138 copies/mL). A negative result must be combined with clinical observations, patient history, and epidemiological information. The expected result is Negative.  Fact Sheet for Patients:  EntrepreneurPulse.com.au  Fact Sheet for Healthcare Providers:  IncredibleEmployment.be  This test is no t yet approved or cleared by the [place]  States FDA and  has been authorized for detection and/or diagnosis of SARS-CoV-2 by FDA under an Emergency Use Authorization (EUA). This EUA will remain  in effect (meaning this test can be used) for the duration of the COVID-19 declaration under Section 564(b)(1) of the Act, 21 U.S.C.section 360bbb-3(b)(1), unless the authorization is terminated  or revoked sooner.       Influenza A by PCR NEGATIVE NEGATIVE Final   Influenza B by PCR NEGATIVE NEGATIVE Final    Comment: (NOTE) The Xpert Xpress SARS-CoV-2/FLU/RSV plus assay is intended as an aid in the diagnosis of influenza from Nasopharyngeal swab specimens and should not be used as a sole basis for treatment. Nasal washings and aspirates are unacceptable for Xpert Xpress SARS-CoV-2/FLU/RSV testing.  Fact Sheet for Patients: EntrepreneurPulse.com.au  Fact Sheet for Healthcare Providers: IncredibleEmployment.be  This test is not yet approved or cleared by the [place] FDA and has been authorized for detection and/or diagnosis of SARS-CoV-2 by FDA under an Emergency Use Authorization (EUA). This EUA will remain in effect (meaning  this test can be used) for the duration of the COVID-19 declaration under Section 564(b)(1) of the Act, 21 U.S.C. section 360bbb-3(b)(1), unless the authorization is terminated or revoked.  Performed at [facility], [address], [place], [place] [address]        Today   Subjective    Kathleen Stokes today has no new complaints No fever  Or chills   No Nausea, Vomiting or Diarrhea  --Tolerating oral intake well         Patient has been seen and examined prior to discharge   Objective   Blood pressure 116/66, pulse (!) 101, temperature 97.6 F (36.4 C), resp. rate 18, height 5' 5"  (1.651 m), weight 124.7 kg, SpO2 95 %.   Intake/Output Summary (Last 24 hours) at 02/05/2020 1450 Last data filed at 02/05/2020 0400 Gross per 24 hour  Intake 1770.66 ml  Output --  Net 1770.66 ml    Exam Gen:- Awake Alert, no acute distress  HEENT:- [place].AT, No sclera icterus Neck-Supple Neck,No JVD,.  Lungs-  CTAB , good air movement bilaterally  CV- S1, S2 normal, regular Abd-  +ve B.Sounds, Abd Soft, much improved upper abdominal/epigastric area tenderness, no guarding, no rebound, Extremity/Skin:- No  edema,   good pulses Psych-affect is appropriate, oriented x3 Neuro-no new focal deficits, no tremors    Data Review   CBC w Diff:  Lab Results  Component Value Date   WBC 16.9 (H) 02/05/2020   HGB 11.8 (L) 02/05/2020   HCT 36.5 02/05/2020   PLT 315 02/05/2020   LYMPHOPCT 30 12/07/2018   MONOPCT 4 12/07/2018   EOSPCT 6 12/07/2018   BASOPCT 1 12/07/2018    CMP:  Lab Results  Component Value Date   NA 131 (L) 02/05/2020   K 3.5 02/05/2020   CL 104 02/05/2020   CO2 23 02/05/2020   BUN 6 02/05/2020   CREATININE 0.67 02/05/2020   PROT 6.6 02/05/2020   ALBUMIN 2.9 (L) 02/05/2020   BILITOT 0.9 02/05/2020   ALKPHOS 142 (H) 02/05/2020   AST 20 02/05/2020   ALT 62 (H) 02/05/2020  .  Total Discharge time is about 33 minutes  Roxan Hockey M.D on 02/05/2020 at 2:50  PM  Go to [email or web address] -  for contact info  [facility] - Office  [phone]

## 2020-02-05 NOTE — Discharge Instructions (Signed)
1) avoid greasy/fatty foods 2) call or return if worsening abdominal pain especially after meals

## 2020-02-05 NOTE — Consult Note (Signed)
Consulting  Provider: Dr. Nehemiah Settle Primary Care Physician:  Sharilyn Sites, MD Primary Gastroenterologist: None  Reason for Consultation: Pancreatitis  HPI:  Kathleen Stokes is a 45 y.o. female with a past medical history of chronic GERD, asthma, diabetes, who presented to [facility] ER yesterday afternoon with a 4-day history of epigastric pain.  Patient states her pain started Thursday of last week and has progressively worsened.  Also notes radiation to her back.  Initially had some nausea and vomiting though this has resolved.  Does note poor p.o. intake as this is led to worsening abdominal pain.  No associated fevers or chills.    Work-up in the ER revealed a lipase 158, AST 42, ALT 109, alk phos 200. CT abdomen pelvis which I personally reviewed showed peripancreatic fat stranding and trace fluid around the pancreatic head and body suggestive of acute pancreatitis.  No evidence of necrosis or cyst formation.  Did also note mild intrahepatic biliary dilation likely due to cholecystectomy.  Patient has been started on IV fluids, as needed analgesia and antiemetics.  Today she states she is feeling better.  She is asking for something to eat.  No nausea or vomiting. Past Medical History:  Diagnosis Date  . Asthma   . GERD (gastroesophageal reflux disease)   . Palpitations   . Prediabetes     Past Surgical History:  Procedure Laterality Date  . CATARACT EXTRACTION Left   . CHOLECYSTECTOMY N/A 12/08/2018   Procedure: LAPAROSCOPIC CHOLECYSTECTOMY;  Surgeon: Virl Cagey, MD;  Location: [facility];  Service: General;  Laterality: N/A;    Prior to Admission medications   Medication Sig Start Date End Date Taking? Authorizing Provider  albuterol (PROVENTIL HFA;VENTOLIN HFA) 108 (90 Base) MCG/ACT inhaler Inhale 2 puffs into the lungs every 6 (six) hours as needed for wheezing or shortness of breath. 05/11/18  Yes Herminio Commons, MD  atorvastatin (LIPITOR) 10 MG tablet Take 1 tablet  (10 mg total) by mouth daily. 05/05/19 01/06/20 Yes Verta Ellen., NP  esomeprazole (NEXIUM) 20 MG capsule Take 40 mg by mouth daily.    Yes [provider]  fluticasone (FLONASE) 50 MCG/ACT nasal spray Place 1 spray into both nostrils daily.    Yes [provider]  metFORMIN (GLUCOPHAGE) 500 MG tablet Take 500 mg by mouth 2 (two) times daily. 12/06/19  Yes [provider]  metoprolol succinate (TOPROL-XL) 50 MG 24 hr tablet Take 1 tablet (50 mg total) by mouth daily. Take with or immediately following a meal. 01/06/20 04/05/20 Yes Verta Ellen., NP    Current Facility-Administered Medications  Medication Dose Route Frequency Provider Last Rate Last Admin  . albuterol (VENTOLIN HFA) 108 (90 Base) MCG/ACT inhaler 2 puff  2 puff Inhalation Q6H PRN Truett Mainland, DO      . atorvastatin (LIPITOR) tablet 10 mg  10 mg Oral Daily Truett Mainland, DO   10 mg at 02/04/20 0626  . enoxaparin (LOVENOX) injection 60 mg  60 mg Subcutaneous Q24H Truett Mainland, DO   60 mg at 02/04/20 2135  . fluticasone (FLONASE) 50 MCG/ACT nasal spray 1 spray  1 spray Each Nare Daily Truett Mainland, DO      . ibuprofen (ADVIL) chewable tablet 400 mg  400 mg Oral Once Adefeso, Oladapo, DO      . insulin aspart (novoLOG) injection 0-15 Units  0-15 Units Subcutaneous Q6H Stinson, Jacob J, DO      . ketorolac (TORADOL) 30 MG/ML  injection 30 mg  30 mg Intravenous Q6H PRN Truett Mainland, DO      . lactated ringers infusion   Intravenous Continuous Truett Mainland, DO 200 mL/hr at 02/04/20 1900 New Bag at 02/04/20 1900  . metoprolol succinate (TOPROL-XL) 24 hr tablet 50 mg  50 mg Oral Daily Truett Mainland, DO      . ondansetron [facility]) tablet 4 mg  4 mg Oral Q6H PRN Truett Mainland, DO       Or  . ondansetron [facility]) injection 4 mg  4 mg Intravenous Q6H PRN Truett Mainland, DO   4 mg at 02/04/20 1659  . pantoprazole (PROTONIX) injection 40 mg  40 mg Intravenous Q24H Truett Mainland,  DO   40 mg at 02/04/20 2357    Allergies as of 02/04/2020 - Review Complete 02/04/2020  Allergen Reaction Noted  . Penicillins Rash 09/29/2017    Family History  Problem Relation Age of Onset  . Heart Problems Mother   . Gallbladder disease Mother     Social History   Socioeconomic History  . Marital status: Single    Spouse name: Not on file  . Number of children: Not on file  . Years of education: Not on file  . Highest education level: Not on file  Occupational History  . Not on file  Tobacco Use  . Smoking status: Never Smoker  . Smokeless tobacco: Never Used  Vaping Use  . Vaping Use: Never used  Substance and Sexual Activity  . Alcohol use: Not Currently  . Drug use: Never  . Sexual activity: Not on file  Other Topics Concern  . Not on file  Social History Narrative  . Not on file   Social Determinants of Health   Financial Resource Strain: Not on file  Food Insecurity: Not on file  Transportation Needs: Not on file  Physical Activity: Not on file  Stress: Not on file  Social Connections: Not on file  Intimate Partner Violence: Not on file    Review of Systems: General: Negative for anorexia, weight loss, fever, chills, fatigue, weakness. Eyes: Negative for vision changes.  ENT: Negative for hoarseness, difficulty swallowing , nasal congestion. CV: Negative for chest pain, angina, palpitations, dyspnea on exertion, peripheral edema.  Respiratory: Negative for dyspnea at rest, dyspnea on exertion, cough, sputum, wheezing.  GI: See history of present illness. GU:  Negative for dysuria, hematuria, urinary incontinence, urinary frequency, nocturnal urination.  MS: Negative for joint pain, low back pain.  Derm: Negative for rash or itching.  Neuro: Negative for weakness, abnormal sensation, seizure, frequent headaches, memory loss, confusion.  Psych: Negative for anxiety, depression, suicidal ideation, hallucinations.  Endo: Negative for unusual weight  change.  Heme: Negative for bruising or bleeding. Allergy: Negative for rash or hives.  Physical Exam: Vital signs in last 24 hours: Temp:  [97.5 F (36.4 C)-99.2 F (37.3 C)] 97.6 F (36.4 C) (01/02 0517) Pulse Rate:  [84-104] 101 (01/02 0517) Resp:  [16-21] 18 (01/02 0517) BP: (98-129)/(52-75) 116/66 (01/02 0517) SpO2:  [87 %-100 %] 95 % (01/02 0517) Weight:  [124.7 kg-125.2 kg] 124.7 kg (01/01 1734) Last BM Date: 02/04/20 General:   Alert,  Well-developed, well-nourished, pleasant and cooperative in NAD, obese Head:  Normocephalic and atraumatic. Eyes:  Sclera clear, no icterus.   Conjunctiva pink. Ears:  Normal auditory acuity. Nose:  No deformity, discharge,  or lesions. Mouth:  No deformity or lesions, dentition normal. Neck:  Supple; no masses or  thyromegaly. Lungs:  Clear throughout to auscultation.   No wheezes, crackles, or rhonchi. No acute distress. Heart:  Regular rate and rhythm; no murmurs, clicks, rubs,  or gallops. Abdomen:  Soft, mildly tender to deep palpation, nondistended. No masses, hepatosplenomegaly or hernias noted. Normal bowel sounds, without guarding, and without rebound.   Rectal:  Deferred until time of colonoscopy.   Msk:  Symmetrical without gross deformities. Normal posture. Pulses:  Normal pulses noted. Extremities:  Without clubbing or edema. Neurologic:  Alert and  oriented x4;  grossly normal neurologically. Skin:  Intact without significant lesions or rashes. Cervical Nodes:  No significant cervical adenopathy. Psych:  Alert and cooperative. Normal mood and affect.  Intake/Output from previous day: 01/01 0701 - 01/02 0700 In: 2820.7 [I.V.:1770.7; IV Piggyback:1050] Out: -  Intake/Output this shift: No intake/output data recorded.  Lab Results: Recent Labs    02/04/20 0922 02/05/20 0618  WBC 21.1* 16.9*  HGB 13.3 11.8*  HCT 41.2 36.5  PLT 416* 315   BMET Recent Labs    02/04/20 0922 02/05/20 0618  NA 133* 131*  K 3.9 3.5   CL 105 104  CO2 20* 23  GLUCOSE 115* 123*  BUN 9 6  CREATININE 0.77 0.67  CALCIUM 8.5* 7.7*   LFT Recent Labs    02/04/20 0922 02/05/20 0618  PROT 8.0 6.6  ALBUMIN 3.5 2.9*  AST 42* 20  ALT 109* 62*  ALKPHOS 200* 142*  BILITOT 1.1 0.9   PT/INR No results for input(s): LABPROT, INR in the last 72 hours. Hepatitis Panel No results for input(s): HEPBSAG, HCVAB, HEPAIGM, HEPBIGM in the last 72 hours. C-Diff No results for input(s): CDIFFTOX in the last 72 hours.  Studies/Results: CT ABDOMEN PELVIS W CONTRAST  Result Date: 02/04/2020 CLINICAL DATA:  Centralized abdominal pain for 2 days EXAM: CT ABDOMEN AND PELVIS WITH CONTRAST TECHNIQUE: Multidetector CT imaging of the abdomen and pelvis was performed using the standard protocol following bolus administration of intravenous contrast. CONTRAST:  127m OMNIPAQUE IOHEXOL 300 MG/ML  SOLN COMPARISON:  None. FINDINGS: Lower chest: No acute abnormality. Hepatobiliary: Status post cholecystectomy. Mild intrahepatic biliary dilatation, likely postsurgical. No focal liver lesion identified. Pancreas: Mild peripancreatic fat stranding around the pancreatic head and body. Trace adjacent free fluid. No organized peripancreatic fluid collection. No parenchymal hypoenhancement. No ductal dilatation. Spleen: Normal in size without focal abnormality. Adrenals/Urinary Tract: Subcentimeter right adrenal gland nodule, too small to definitively characterize. Kidneys are within normal limits. No renal lesion, stone, or hydronephrosis. Urinary bladder is unremarkable. Stomach/Bowel: Small hiatal hernia. Stomach otherwise unremarkable. No dilated loops of bowel. Scattered colonic diverticulosis. No focal bowel wall thickening or inflammatory changes. Vascular/Lymphatic: No significant vascular findings are identified. There are numerous nonenlarged mesenteric and retroperitoneal lymph nodes, likely reactive. Reproductive: IUD present within the endometrial canal.  No adnexal masses. Other: Small volume free fluid within the pelvis. No organized abdominopelvic fluid collection. No pneumoperitoneum. Musculoskeletal: No acute or significant osseous findings. IMPRESSION: 1. Peripancreatic fat stranding and trace fluid around the pancreatic head and body suggestive of acute pancreatitis. No organized peripancreatic fluid collection. 2. Small volume free fluid within the pelvis, likely reactive. 3. Status post cholecystectomy with mild intrahepatic biliary dilatation, likely postsurgical. 4. Small hiatal hernia. 5. Scattered colonic diverticulosis without evidence of acute diverticulitis. Electronically Signed   By: NDavina PokeD.O.   On: 02/04/2020 11:56   DG Chest Port 1 View  Result Date: 02/04/2020 CLINICAL DATA:  Shortness of breath and chest pain for 2  weeks. EXAM: PORTABLE CHEST 1 VIEW COMPARISON:  None. FINDINGS: The heart size and mediastinal contours are within normal limits. Mild atelectasis of left lung base is noted. Both lungs are otherwise clear. The visualized skeletal structures are unremarkable. IMPRESSION: Mild atelectasis of left lung base.  No focal pneumonia. Electronically Signed   By: Abelardo Diesel M.D.   On: 02/04/2020 10:26    Impression: *Acute pancreatitis *Abnormal liver function test *Chronic GERD  Plan: Etiology of patient's acute pancreatitis unclear.  Given her abnormal LFTs, possible she has a retained stone that is since passed and we are seeing the downtrending sequela of this.  Denies alcohol use.  Triglycerides September 2020 were WNL.   Patient does appear improved today, requesting something to eat.  Recommend advancing diet as tolerated.  Ultrasound pending to rule out CBD stone, dilation likely chronic due to cholecystectomy  Continue IV fluids, antiemetics, analgesia as needed  If patient's ultrasound unremarkable, can likely discharge later today as long as she is tolerating diet.  Continue Esomeprazole for  chronic GERD.   GI continue to follow.  Thank you for letting us take part in this patient's care.  Elon Alas. Abbey Chatters, D.O. Gastroenterology and Hepatology [facility]    LOS: 0 days     02/05/2020, 7:43 AM

## 2020-02-08 NOTE — Progress Notes (Signed)
Cardiology Office Note  Date: 02/09/2020   ID: Kathleen Stokes, DOB August 31, 1975, MRN [id number]  PCP:  Sharilyn Sites, MD  Cardiologist:  Rozann Lesches, MD Electrophysiologist:  None   Chief Complaint: Follow-up palpitations  History of Present Illness: Kathleen Stokes is a 45 y.o. female with a history of palpitations.  Last encounter with Dr. Domenic Polite 10/24/2019.  History of palpitations with documented PACs by cardiac monitor and treated with beta-blocker therapy.  Reported intermittent symptoms precipitated by anxiety or stressful situations.  She had increased her Lopressor to 50 mg twice daily within the prior few days before her visit and was feeling somewhat better.  Dr. Domenic Polite agreed with increasing the Lopressor to 50 mg twice daily.  He stated that this was not affected with be switching to Toprol-XL 100 mg daily.  He would consider remonitoring if symptoms escalated.   She was last here for follow up for palpitations on 01/06/2020.  She stated in the interim since last visit she had upped her dose of her metoprolol due to increasing palpitations.  She stated that palpitations seemed to have increased since last visit and were more bothersome. Usually more prevalent during the evening when she was attempting to sleep.  She denied any associated orthostatic symptoms i.e. lightheadedness, dizziness, presyncopal or syncopal episodes.  Denied any shortness of breath when palpitation occur.  She stated she did snore but had not noticed any apneic periods nor has anyone else noticed her having apneic periods.  She denied any excessive daytime sleepiness/hypersomnia.  Recent admission for epigastric abdominal pain 02/05/2020.  Diagnosis was acute pancreatitis which improved with bowel rest and IV fluids.  Lipase was initially 158 and decreased to 55 prior to discharge.  Had elevated LFTs with AST at 42 down to 20, ALT 109 down to 62.  Alkaline phosphatase 200 down to 142.  Total bilirubin 1.1  down to 0.9. She was hyponatremic secondary to dehydration.  She presents today for follow-up for palpitations and recent cardiac monitor.  Apparently recent cardiac monitor results are not back as of yet.  She states she turned in the monitor December 18.  She continues to complain of palpitations on and off during the day.  Also complaining of some chest pain/abdominal pain with exertion.  She states last week she felt like she was having a heart attack secondary to palpitations/racing heart and chest pain/abdominal pain.  Also complaining of increased dyspnea on exertion recently.  States she has lost approximately 10 pounds since she was sick in the hospital from pancreatitis.   Past Medical History:  Diagnosis Date  . Asthma   . GERD (gastroesophageal reflux disease)   . Palpitations   . Prediabetes     Past Surgical History:  Procedure Laterality Date  . CATARACT EXTRACTION Left   . CHOLECYSTECTOMY N/A 12/08/2018   Procedure: LAPAROSCOPIC CHOLECYSTECTOMY;  Surgeon: Virl Cagey, MD;  Location: [facility];  Service: General;  Laterality: N/A;    Current Outpatient Medications  Medication Sig Dispense Refill  . acetaminophen (TYLENOL) 325 MG tablet Take 2 tablets (650 mg total) by mouth every 8 (eight) hours as needed for mild pain, fever or headache. 12 tablet 0  . albuterol (PROVENTIL HFA;VENTOLIN HFA) 108 (90 Base) MCG/ACT inhaler Inhale 2 puffs into the lungs every 6 (six) hours as needed for wheezing or shortness of breath. 1 Inhaler 0  . atorvastatin (LIPITOR) 20 MG tablet Take 1 tablet (20 mg total) by mouth daily. For Cholesterol 90 tablet 3  .  diltiazem (CARDIZEM) 30 MG tablet Take 1 tablet (30 mg total) by mouth 2 (two) times daily. 60 tablet 11  . esomeprazole (NEXIUM) 40 MG capsule Take 1 capsule (40 mg total) by mouth daily. For Stomach 90 capsule 3  . fluticasone (FLONASE) 50 MCG/ACT nasal spray Place 1 spray into both nostrils daily.     . metFORMIN (GLUCOPHAGE) 500 MG  tablet Take 1 tablet (500 mg total) by mouth 2 (two) times daily. 180 tablet 2  . metoprolol succinate (TOPROL-XL) 50 MG 24 hr tablet Take 1 tablet (50 mg total) by mouth daily. Take with or immediately following a meal. 90 tablet 2  . traMADol (ULTRAM) 50 MG tablet Take 1 tablet (50 mg total) by mouth every 12 (twelve) hours as needed for up to 5 days. 6 tablet 0   No current facility-administered medications for this visit.   Allergies:  Penicillins   Social History: The patient  reports that she has never smoked. She has never used smokeless tobacco. She reports previous alcohol use. She reports that she does not use drugs.   Family History: The patient's family history includes Gallbladder disease in her mother; Heart Problems in her mother.   ROS:  Please see the history of present illness. Otherwise, complete review of systems is positive for none.  All other systems are reviewed and negative.   Physical Exam: VS:  BP 122/80   Pulse 72   Ht 5\' 5"  (1.651 m)   Wt 269 lb 12.8 oz (122.4 kg)   LMP  (LMP Unknown)   SpO2 97%   BMI 44.90 kg/m , BMI Body mass index is 44.9 kg/m.  Wt Readings from Last 3 Encounters:  02/09/20 269 lb 12.8 oz (122.4 kg)  02/04/20 274 lb 14.6 oz (124.7 kg)  01/06/20 273 lb (123.8 kg)    General: Morbidly obese patient appears comfortable at rest. Neck: Supple, no elevated JVP or carotid bruits, no thyromegaly. Lungs: Clear to auscultation, nonlabored breathing at rest. Cardiac: Regular rate and rhythm, no S3 or significant systolic murmur, no pericardial rub. Extremities: No pitting edema, distal pulses 2+. Skin: Warm and dry. Musculoskeletal: No kyphosis. Neuropsychiatric: Alert and oriented x3, affect grossly appropriate.  ECG:    Recent Labwork: 05/13/2019: TSH 0.76 02/05/2020: ALT 62; AST 20; BUN 6; Creatinine, Ser 0.67; Hemoglobin 11.8; Platelets 315; Potassium 3.5; Sodium 131  No results found for: CHOL, TRIG, HDL, CHOLHDL, VLDL, LDLCALC,  LDLDIRECT  Other Studies Reviewed Today:   Echocardiogram 10/08/2017: - Left ventricle: The cavity size was normal. Wall thickness was  normal. Systolic function was normal. The estimated ejection  fraction was in the range of 60% to 65%. Wall motion was normal;  there were no regional wall motion abnormalities. Left  ventricular diastolic function parameters were normal.  - Aortic valve: Valve area (VTI): 1.81 cm^2. Valve area (Vmax):  1.76 cm^2. Valve area (Vmean): 1.75 cm^2.  - Atrial septum: No defect or patent foramen ovale was identified.   Cardiac monitor September 2019:  Sinus rhythm and sinus tachycardia with frequent PACs and isolated PVCs. Average heart rate 103 bpm.  Symptoms primarily correlated with sinus tachycardia and PACs.   Assessment and Plan:   1. Palpitations Continues to complain of palpitations in spite of changing from metoprolol to Toprol.  She states last week she felt like she was having a heart attack secondary to increased palpitations/racing heart.  Recently had a cardiac monitor placed and turned today and on December 18.  We do  not have those results back.  She states her palpitations are occurring on and off during the course of the day.  Continue Toprol-XL 50 mg daily.  Add Cardizem 30 mg p.o. twice daily.  We are awaiting cardiac monitor results.  2.  Suspected sleep apnea At last visit she admitted to snoring.  She denied anyone noticing apneic episodes.  She states she did not notice any daytime hypersomnia / excessive daytime sleepiness.  Body habitus/morphology appears conducive to sleep apnea.  She was referred for sleep study.     3.  Chest pain States she has been having some chest pain/abdominal pain over the last few weeks along with her palpitations.  Recently had acute pancreatitis.  She is unsure if chest pain is coming from pancreatitis or from her heart.  Please schedule a Lexiscan stress test to assess for possible ischemic  origin of chest pain..  4.  Complaining of increasing dyspnea on exertion. Complaining of increasing dyspnea on exertion with activity and associated with palpitations.  Please get an echocardiogram to assess LV function, diastolic function, valvular function.   Medication Adjustments/Labs and Tests Ordered: Current medicines are reviewed at length with the patient today.  Concerns regarding medicines are outlined above.   Disposition: Follow-up with Dr. Diona Browner or APP 1 month  Signed, Rennis Harding, NP 02/09/2020 11:21 AM    [facility] at [facility] [address] [place], [place], [place] [address] Phone: [phone]; Fax: [phone]

## 2020-02-09 ENCOUNTER — Ambulatory Visit (INDEPENDENT_AMBULATORY_CARE_PROVIDER_SITE_OTHER): Payer: Commercial Managed Care - PPO | Admitting: Family Medicine

## 2020-02-09 ENCOUNTER — Encounter: Payer: Self-pay | Admitting: *Deleted

## 2020-02-09 ENCOUNTER — Encounter: Payer: Self-pay | Admitting: Family Medicine

## 2020-02-09 VITALS — BP 122/80 | HR 72 | Ht 65.0 in | Wt 269.8 lb

## 2020-02-09 DIAGNOSIS — R0602 Shortness of breath: Secondary | ICD-10-CM

## 2020-02-09 DIAGNOSIS — R002 Palpitations: Secondary | ICD-10-CM

## 2020-02-09 DIAGNOSIS — R29818 Other symptoms and signs involving the nervous system: Secondary | ICD-10-CM | POA: Diagnosis not present

## 2020-02-09 DIAGNOSIS — R079 Chest pain, unspecified: Secondary | ICD-10-CM | POA: Diagnosis not present

## 2020-02-09 MED ORDER — DILTIAZEM HCL 30 MG PO TABS: 30.0000 mg | ORAL_TABLET | Freq: Two times a day (BID) | ORAL | 11 refills | Status: DC

## 2020-02-09 NOTE — Patient Instructions (Addendum)
Medication Instructions:  Your physician has recommended you make the following change in your medication:   Start Diltiazem 30 mg Two Times Daily  *If you need a refill on your cardiac medications before your next appointment, please call your pharmacy*   Lab Work: NONE   If you have labs (blood work) drawn today and your tests are completely normal, you will receive your results only by: Marland Kitchen MyChart Message (if you have MyChart) OR . A paper copy in the mail If you have any lab test that is abnormal or we need to change your treatment, we will call you to review the results.   Testing/Procedures: Your physician has requested that you have an echocardiogram. Echocardiography is a painless test that uses sound waves to create images of your heart. It provides your doctor with information about the size and shape of your heart and how well your heart's chambers and valves are working. This procedure takes approximately one hour. There are no restrictions for this procedure.  Your physician has requested that you have a lexiscan myoview. For further information please visit [email or web address] Please follow instruction sheet, as given.   Follow-Up: At [facility], you and your health needs are our priority.  As part of our continuing mission to provide you with exceptional heart care, we have created designated Provider Care Teams.  These Care Teams include your primary Cardiologist (physician) and Advanced Practice Providers (APPs -  Physician Assistants and Nurse Practitioners) who all work together to provide you with the care you need, when you need it.  We recommend signing up for the patient portal called "MyChart".  Sign up information is provided on this After Visit Summary.  MyChart is used to connect with patients for Virtual Visits (Telemedicine).  Patients are able to view lab/test results, encounter notes, upcoming appointments, etc.  Non-urgent messages can be sent to your  provider as well.   To learn more about what you can do with MyChart, go to [email or web address].    Your next appointment:   1 month(s)  The format for your next appointment:   In Person  Provider:   Nena Polio, NP  Other Instructions Thank you for choosing [facility]!

## 2020-02-14 ENCOUNTER — Telehealth: Payer: Self-pay | Admitting: *Deleted

## 2020-02-14 NOTE — Telephone Encounter (Signed)
Hello, There is a note regarding this device that shows the device was most likely never activated:     No data is available on patch [id number]. The device was likely never activated. Please contact the appropriate advocates for a possible re-patch.  Serial number : [id number] Patient name : Kathleen Stokes Account name : [place] Date received : 01/30/2020

## 2020-02-17 ENCOUNTER — Ambulatory Visit (HOSPITAL_COMMUNITY): Admission: RE | Admit: 2020-02-17 | Payer: Commercial Managed Care - PPO | Source: Ambulatory Visit

## 2020-02-17 ENCOUNTER — Encounter (HOSPITAL_COMMUNITY): Payer: Commercial Managed Care - PPO

## 2020-02-24 ENCOUNTER — Ambulatory Visit: Payer: Commercial Managed Care - PPO

## 2020-02-24 ENCOUNTER — Other Ambulatory Visit: Payer: Self-pay | Admitting: Family Medicine

## 2020-02-24 DIAGNOSIS — R002 Palpitations: Secondary | ICD-10-CM

## 2020-02-24 NOTE — Telephone Encounter (Signed)
Per Otto Herb with [employer]-monitor has to be re-enrolled, then shipped to patient since order was placed prior to integration period.

## 2020-02-24 NOTE — Telephone Encounter (Signed)
Mother informed. Advised that new monitor will be shipped to home address and that she is to wear for another 14 days-spoke with mother

## 2020-03-02 ENCOUNTER — Encounter (HOSPITAL_COMMUNITY): Payer: Commercial Managed Care - PPO

## 2020-03-02 ENCOUNTER — Ambulatory Visit (HOSPITAL_COMMUNITY): Payer: Commercial Managed Care - PPO

## 2020-03-16 ENCOUNTER — Ambulatory Visit: Payer: Commercial Managed Care - PPO | Admitting: Cardiology

## 2020-03-16 NOTE — Progress Notes (Deleted)
Cardiology Office Note  Date: 03/16/2020   ID: Kathleen Stokes, DOB [birth year], MRN [id number]  PCP:  Sharilyn Sites, MD  Cardiologist:  Rozann Lesches, MD Electrophysiologist:  None   No chief complaint on file.   History of Present Illness: Kathleen Stokes is a 45 y.o. female last seen in January by Mr. Leonides Sake NP.  Echocardiogram and Lexiscan Myoview were ordered by Mr. Leonides Sake NP in early January, not yet completed.  Past Medical History:  Diagnosis Date  . Asthma   . GERD (gastroesophageal reflux disease)   . Palpitations   . Prediabetes     Past Surgical History:  Procedure Laterality Date  . CATARACT EXTRACTION Left   . CHOLECYSTECTOMY N/A 12/08/2018   Procedure: LAPAROSCOPIC CHOLECYSTECTOMY;  Surgeon: Virl Cagey, MD;  Location: [facility];  Service: General;  Laterality: N/A;    Current Outpatient Medications  Medication Sig Dispense Refill  . acetaminophen (TYLENOL) 325 MG tablet Take 2 tablets (650 mg total) by mouth every 8 (eight) hours as needed for mild pain, fever or headache. 12 tablet 0  . albuterol (PROVENTIL HFA;VENTOLIN HFA) 108 (90 Base) MCG/ACT inhaler Inhale 2 puffs into the lungs every 6 (six) hours as needed for wheezing or shortness of breath. 1 Inhaler 0  . atorvastatin (LIPITOR) 20 MG tablet Take 1 tablet (20 mg total) by mouth daily. For Cholesterol 90 tablet 3  . diltiazem (CARDIZEM) 30 MG tablet Take 1 tablet (30 mg total) by mouth 2 (two) times daily. 60 tablet 11  . esomeprazole (NEXIUM) 40 MG capsule Take 1 capsule (40 mg total) by mouth daily. For Stomach 90 capsule 3  . fluticasone (FLONASE) 50 MCG/ACT nasal spray Place 1 spray into both nostrils daily.     . metFORMIN (GLUCOPHAGE) 500 MG tablet Take 1 tablet (500 mg total) by mouth 2 (two) times daily. 180 tablet 2  . metoprolol succinate (TOPROL-XL) 50 MG 24 hr tablet Take 1 tablet (50 mg total) by mouth daily. Take with or immediately following a meal. 90 tablet 2   No current  facility-administered medications for this visit.   Allergies:  Penicillins   Social History: The patient  reports that she has never smoked. She has never used smokeless tobacco. She reports previous alcohol use. She reports that she does not use drugs.   Family History: The patient's family history includes Gallbladder disease in her mother; Heart Problems in her mother.   ROS:  Please see the history of present illness. Otherwise, complete review of systems is positive for {NONE DEFAULTED:18576::"none"}.  All other systems are reviewed and negative.   Physical Exam: VS:  There were no vitals taken for this visit., BMI There is no height or weight on file to calculate BMI.  Wt Readings from Last 3 Encounters:  02/09/20 269 lb 12.8 oz (122.4 kg)  02/04/20 274 lb 14.6 oz (124.7 kg)  01/06/20 273 lb (123.8 kg)    General: Patient appears comfortable at rest. HEENT: Conjunctiva and lids normal, oropharynx clear with moist mucosa. Neck: Supple, no elevated JVP or carotid bruits, no thyromegaly. Lungs: Clear to auscultation, nonlabored breathing at rest. Cardiac: Regular rate and rhythm, no S3 or significant systolic murmur, no pericardial rub. Abdomen: Soft, nontender, no hepatomegaly, bowel sounds present, no guarding or rebound. Extremities: No pitting edema, distal pulses 2+. Skin: Warm and dry. Musculoskeletal: No kyphosis. Neuropsychiatric: Alert and oriented x3, affect grossly appropriate.  ECG:  An ECG dated 02/04/2020 was personally reviewed today and demonstrated:  Sinus tachycardia with PACs.  Recent Labwork: 05/13/2019: TSH 0.76 02/05/2020: ALT 62; AST 20; BUN 6; Creatinine, Ser 0.67; Hemoglobin 11.8; Platelets 315; Potassium 3.5; Sodium 131   Other Studies Reviewed Today:  Echocardiogram 10/08/2017: - Left ventricle: The cavity size was normal. Wall thickness was  normal. Systolic function was normal. The estimated ejection  fraction was in the range of 60% to 65%. Wall  motion was normal;  there were no regional wall motion abnormalities. Left  ventricular diastolic function parameters were normal.  - Aortic valve: Valve area (VTI): 1.81 cm^2. Valve area (Vmax):  1.76 cm^2. Valve area (Vmean): 1.75 cm^2.  - Atrial septum: No defect or patent foramen ovale was identified.   Assessment and Plan:    Medication Adjustments/Labs and Tests Ordered: Current medicines are reviewed at length with the patient today.  Concerns regarding medicines are outlined above.   Tests Ordered: No orders of the defined types were placed in this encounter.   Medication Changes: No orders of the defined types were placed in this encounter.   Disposition:  Follow up {follow up:15908}  Signed, Satira Sark, MD, [facility] 03/16/2020 8:10 AM    [facility] [place], [place], [place] [address] Phone: [phone]; Fax: [phone]

## 2020-03-19 ENCOUNTER — Ambulatory Visit: Payer: Commercial Managed Care - PPO | Admitting: Cardiology

## 2020-03-26 ENCOUNTER — Ambulatory Visit: Payer: Commercial Managed Care - PPO | Admitting: Family Medicine

## 2020-03-27 ENCOUNTER — Telehealth: Payer: Self-pay | Admitting: Family Medicine

## 2020-03-27 ENCOUNTER — Encounter (HOSPITAL_COMMUNITY): Payer: Commercial Managed Care - PPO

## 2020-03-27 ENCOUNTER — Ambulatory Visit (HOSPITAL_COMMUNITY): Payer: Commercial Managed Care - PPO

## 2020-03-27 NOTE — Telephone Encounter (Signed)
Okay thank you

## 2020-03-27 NOTE — Telephone Encounter (Signed)
Patient called to cancel her Stress Test and Echo due to a conflict with her work. States that she will call later to re-schedule.

## 2020-03-28 ENCOUNTER — Ambulatory Visit (HOSPITAL_COMMUNITY): Payer: Commercial Managed Care - PPO

## 2020-04-04 NOTE — Progress Notes (Signed)
Cardiology Office Note  Date: 04/05/2020   ID: Kathleen Stokes, DOB 01-30-1976, MRN [id number]  PCP:  Sharilyn Sites, MD  Cardiologist:  Rozann Lesches, MD Electrophysiologist:  None   Chief Complaint: Follow-up palpitations  History of Present Illness: Kathleen Stokes is a 45 y.o. female with a history of palpitations.  Last encounter with Dr. Domenic Polite 10/24/2019.  History of palpitations with documented PACs by cardiac monitor and treated with beta-blocker therapy.  Reported intermittent symptoms precipitated by anxiety or stressful situations.  She had increased her Lopressor to 50 mg twice daily within the prior few days before her visit and was feeling somewhat better.  Dr. Domenic Polite agreed with increasing the Lopressor to 50 mg twice daily.  He stated that this was not affected with be switching to Toprol-XL 100 mg daily.  He would consider remonitoring if symptoms escalated.   She is here for follow-up status post starting Cardizem for increased palpitations.  She states the palpitations have significantly decreased since starting the medication.  States she has occasional short-lived palpitations.  We had ordered an event monitor in the interim and an echocardiogram.  Patient had neither due to having Covid infection and admission for pancreatitis.  She denies any anginal symptoms or dyspnea, orthostatic symptoms/presyncope/syncope., palpitations, CVA or TIA-like symptoms, PND, orthopnea.  No lower extremity edema.   Past Medical History:  Diagnosis Date  . Asthma   . GERD (gastroesophageal reflux disease)   . Palpitations   . Prediabetes     Past Surgical History:  Procedure Laterality Date  . CATARACT EXTRACTION Left   . CHOLECYSTECTOMY N/A 12/08/2018   Procedure: LAPAROSCOPIC CHOLECYSTECTOMY;  Surgeon: Virl Cagey, MD;  Location: [facility];  Service: General;  Laterality: N/A;    Current Outpatient Medications  Medication Sig Dispense Refill  . albuterol (PROVENTIL  HFA;VENTOLIN HFA) 108 (90 Base) MCG/ACT inhaler Inhale 2 puffs into the lungs every 6 (six) hours as needed for wheezing or shortness of breath. 1 Inhaler 0  . atorvastatin (LIPITOR) 20 MG tablet Take 1 tablet (20 mg total) by mouth daily. For Cholesterol 90 tablet 3  . diltiazem (CARDIZEM) 30 MG tablet Take 1 tablet (30 mg total) by mouth 2 (two) times daily. 60 tablet 11  . esomeprazole (NEXIUM) 40 MG capsule Take 1 capsule (40 mg total) by mouth daily. For Stomach 90 capsule 3  . fluticasone (FLONASE) 50 MCG/ACT nasal spray Place 1 spray into both nostrils daily.     . metFORMIN (GLUCOPHAGE) 500 MG tablet Take 1 tablet (500 mg total) by mouth 2 (two) times daily. 180 tablet 2  . metoprolol succinate (TOPROL-XL) 50 MG 24 hr tablet Take 1 tablet (50 mg total) by mouth daily. Take with or immediately following a meal. 90 tablet 2   No current facility-administered medications for this visit.   Allergies:  Penicillins   Social History: The patient  reports that she has never smoked. She has never used smokeless tobacco. She reports previous alcohol use. She reports that she does not use drugs.   Family History: The patient's family history includes Gallbladder disease in her mother; Heart Problems in her mother.   ROS:  Please see the history of present illness. Otherwise, complete review of systems is positive for none.  All other systems are reviewed and negative.   Physical Exam: VS:  BP 128/84   Pulse 75   Ht 5\' 5"  (1.651 m)   Wt 278 lb 9.6 oz (126.4 kg)   SpO2 97%  BMI 46.36 kg/m , BMI Body mass index is 46.36 kg/m.  Wt Readings from Last 3 Encounters:  04/05/20 278 lb 9.6 oz (126.4 kg)  02/09/20 269 lb 12.8 oz (122.4 kg)  02/04/20 274 lb 14.6 oz (124.7 kg)    General: Morbidly obese patient appears comfortable at rest. Neck: Supple, no elevated JVP or carotid bruits, no thyromegaly. Lungs: Clear to auscultation, nonlabored breathing at rest. Cardiac: Regular rate and rhythm,  no S3 or significant systolic murmur, no pericardial rub. Extremities: No pitting edema, distal pulses 2+. Skin: Warm and dry. Musculoskeletal: No kyphosis. Neuropsychiatric: Alert and oriented x3, affect grossly appropriate.  ECG:    Recent Labwork: 05/13/2019: TSH 0.76 02/05/2020: ALT 62; AST 20; BUN 6; Creatinine, Ser 0.67; Hemoglobin 11.8; Platelets 315; Potassium 3.5; Sodium 131  No results found for: CHOL, TRIG, HDL, CHOLHDL, VLDL, LDLCALC, LDLDIRECT  Other Studies Reviewed Today:   Echocardiogram 10/08/2017: - Left ventricle: The cavity size was normal. Wall thickness was  normal. Systolic function was normal. The estimated ejection  fraction was in the range of 60% to 65%. Wall motion was normal;  there were no regional wall motion abnormalities. Left  ventricular diastolic function parameters were normal.  - Aortic valve: Valve area (VTI): 1.81 cm^2. Valve area (Vmax):  1.76 cm^2. Valve area (Vmean): 1.75 cm^2.  - Atrial septum: No defect or patent foramen ovale was identified.   Cardiac monitor September 2019:  Sinus rhythm and sinus tachycardia with frequent PACs and isolated PVCs. Average heart rate 103 bpm.  Symptoms primarily correlated with sinus tachycardia and PACs.   Assessment and Plan:   1. Palpitations She states palpitations have significantly improved since adding Cardizem 30 mg p.o. twice daily.  States she has only occasional short-lived palpitations which are not bothersome.  Continue Toprol-XL 50 mg daily.  Continue Cardizem 30 mg p.o. twice daily.  Advised her she could take an extra dose of 30 mg if she has breakthrough palpitations as needed.  Since palpitations have decreased significantly we are not proceeding with event monitor.  2.  Suspected sleep apnea At last visit she admitted to snoring.  She denied anyone noticing apneic episodes.  She states she did not notice any daytime hypersomnia / excessive daytime sleepiness.  Body  habitus/morphology appears conducive to sleep apnea.  She was referred for sleep study.    3.  Chest pain Previously having chest pain.  Recently had acute pancreatitis.  She was unsure if chest pain is coming from pancreatitis or from her heart.  We scheduled a Lexiscan stress test to assess for possible ischemic origin of chest pain.  Patient did not follow through with having the stress test done.  She currently denies any chest pain.  Will defer ischemic evaluation for now unless the chest pain recurs.  4.  Complaining of increasing dyspnea on exertion. No real complaints of dyspnea on exertion since starting Cardizem.  We had ordered an echocardiogram to discern because of DOE.  Patient never followed through on getting echocardiogram.  We will defer echo for now given patient's improvement.  Medication Adjustments/Labs and Tests Ordered: Current medicines are reviewed at length with the patient today.  Concerns regarding medicines are outlined above.   Disposition: Follow-up with Dr. Domenic Polite or APP 6 months Signed, Levell July, NP 04/05/2020 9:45 AM    [facility] at [place], [place], [place] [address] Phone: [phone]; Fax: [phone]

## 2020-04-05 ENCOUNTER — Encounter: Payer: Self-pay | Admitting: Family Medicine

## 2020-04-05 ENCOUNTER — Telehealth: Payer: Self-pay | Admitting: Family Medicine

## 2020-04-05 ENCOUNTER — Ambulatory Visit (INDEPENDENT_AMBULATORY_CARE_PROVIDER_SITE_OTHER): Payer: Commercial Managed Care - PPO | Admitting: Family Medicine

## 2020-04-05 VITALS — BP 128/84 | HR 75 | Ht 65.0 in | Wt 278.6 lb

## 2020-04-05 DIAGNOSIS — R0609 Other forms of dyspnea: Secondary | ICD-10-CM

## 2020-04-05 DIAGNOSIS — R002 Palpitations: Secondary | ICD-10-CM | POA: Diagnosis not present

## 2020-04-05 DIAGNOSIS — R29818 Other symptoms and signs involving the nervous system: Secondary | ICD-10-CM

## 2020-04-05 DIAGNOSIS — R079 Chest pain, unspecified: Secondary | ICD-10-CM | POA: Diagnosis not present

## 2020-04-05 DIAGNOSIS — R0600 Dyspnea, unspecified: Secondary | ICD-10-CM

## 2020-04-05 MED ORDER — METOPROLOL SUCCINATE ER 50 MG PO TB24: 50.0000 mg | ORAL_TABLET | Freq: Every day | ORAL | 1 refills | Status: DC

## 2020-04-05 NOTE — Patient Instructions (Signed)
Medication Instructions:  Continue all current medications.   Labwork: none  Testing/Procedures: none  Follow-Up: 6 months   Any Other Special Instructions Will Be Listed Below (If Applicable).   If you need a refill on your cardiac medications before your next appointment, please call your pharmacy.  

## 2020-04-05 NOTE — Telephone Encounter (Signed)
Medication sent to pharmacy  

## 2020-04-05 NOTE — Telephone Encounter (Signed)
    1. Which medications need to be refilled? (please list name of each medication and dose if known)   Toprol XL 5O MG   2. Which pharmacy/location (including street and city if local pharmacy) is medication to be sent to?  [facility] [place]   3. Do they need a 30 day or 90 day supply? 90  Patient will need enough refills to last for 6 months.

## 2020-06-28 ENCOUNTER — Telehealth: Payer: Self-pay | Admitting: Family Medicine

## 2020-06-28 MED ORDER — METOPROLOL SUCCINATE ER 50 MG PO TB24: 50.0000 mg | ORAL_TABLET | Freq: Every day | ORAL | 3 refills | Status: DC

## 2020-06-28 NOTE — Telephone Encounter (Signed)
  1. Which medications need to be refilled? (please list name of each medication and dose if known)   Toprol XL 5O MG   2. Which pharmacy/location (including street and city if local pharmacy) is medication to be sent to?  [facility] [place]   3. Do they need a 30 day or 90 day supply? 90  Patient has appointment September 2022

## 2020-06-28 NOTE — Telephone Encounter (Signed)
Done

## 2020-10-24 ENCOUNTER — Ambulatory Visit: Payer: Commercial Managed Care - PPO | Admitting: Cardiology

## 2020-10-31 ENCOUNTER — Encounter: Payer: Self-pay | Admitting: Gastroenterology

## 2020-12-13 ENCOUNTER — Encounter: Payer: Self-pay | Admitting: Gastroenterology

## 2020-12-13 ENCOUNTER — Ambulatory Visit (AMBULATORY_SURGERY_CENTER): Payer: Self-pay | Admitting: *Deleted

## 2020-12-13 ENCOUNTER — Other Ambulatory Visit: Payer: Self-pay

## 2020-12-13 VITALS — Ht 65.0 in | Wt 250.0 lb

## 2020-12-13 DIAGNOSIS — Z1211 Encounter for screening for malignant neoplasm of colon: Secondary | ICD-10-CM

## 2020-12-13 MED ORDER — NA SULFATE-K SULFATE-MG SULF 17.5-3.13-1.6 GM/177ML PO SOLN: 1.0000 | ORAL | 0 refills | Status: DC

## 2020-12-13 NOTE — Progress Notes (Signed)
Patient is here in-person for PV. Patient denies any allergies to eggs or soy. Patient denies any problems with anesthesia/sedation. Patient is not on any oxygen at home. Patient is not taking any diet/weight loss medications or blood thinners. Patient is aware of our care-partner policy and Covid-19 safety protocol.   EMMI education assigned to the patient for the procedure, sent to MyChart.   Patient is COVID-19 vaccinated.  

## 2020-12-25 NOTE — Progress Notes (Signed)
Cardiology Office Note  Date: 12/26/2020   ID: Kathleen Stokes, DOB [birth year], MRN [id number]  PCP:  Sharilyn Sites, MD  Cardiologist:  Rozann Lesches, MD Electrophysiologist:  None   Chief Complaint  Patient presents with   Cardiac follow-up    History of Present Illness: Kathleen Stokes is a 45 y.o. female last seen in March by Mr. Leonides Sake NP.  She is here for a routine visit.  Reports very good control of palpitations on combination of Toprol-XL and diltiazem as noted below.  No change in stamina.  No dizziness or syncope.  She continues to follow with Dr. Hilma Favors.  Reports no major change in health since last visit.  Past Medical History:  Diagnosis Date   Asthma    Diabetes mellitus without complication ([place])    GERD (gastroesophageal reflux disease)    Palpitations    Prediabetes     Past Surgical History:  Procedure Laterality Date   CATARACT EXTRACTION Left    CHOLECYSTECTOMY N/A 12/08/2018   Procedure: LAPAROSCOPIC CHOLECYSTECTOMY;  Surgeon: Virl Cagey, MD;  Location: [facility];  Service: General;  Laterality: N/A;    Current Outpatient Medications  Medication Sig Dispense Refill   albuterol (PROVENTIL HFA;VENTOLIN HFA) 108 (90 Base) MCG/ACT inhaler Inhale 2 puffs into the lungs every 6 (six) hours as needed for wheezing or shortness of breath. 1 Inhaler 0   atorvastatin (LIPITOR) 20 MG tablet Take 1 tablet (20 mg total) by mouth daily. For Cholesterol 90 tablet 3   diltiazem (CARDIZEM) 30 MG tablet Take 1 tablet (30 mg total) by mouth 2 (two) times daily. 60 tablet 11   famotidine (PEPCID) 40 MG tablet Take 40 mg by mouth daily.     fluticasone (FLONASE) 50 MCG/ACT nasal spray Place 1 spray into both nostrils daily.      metFORMIN (GLUCOPHAGE) 500 MG tablet Take 1 tablet (500 mg total) by mouth 2 (two) times daily. 180 tablet 2   metoprolol succinate (TOPROL-XL) 50 MG 24 hr tablet Take 1 tablet (50 mg total) by mouth daily. 90 tablet 3   MOUNJARO 5 MG/0.5ML  Pen SMARTSIG:1 pre-filled pen syringe SUB-Q Once a Week     Na Sulfate-K Sulfate-Mg Sulf 17.5-3.13-1.6 GM/177ML SOLN Take 1 kit by mouth as directed. May use generic Suprep 354 mL 0   pantoprazole (PROTONIX) 40 MG tablet Take 40 mg by mouth 2 (two) times daily.     No current facility-administered medications for this visit.   Allergies:  Penicillins   ROS: No orthopnea or PND.  Physical Exam: VS:  BP 134/74   Pulse 73   Ht _0  (1.651 m)   Wt 249 lb 9.6 oz (113.2 kg)   SpO2 98%   BMI 41.54 kg/m , BMI Body mass index is 41.54 kg/m.  Wt Readings from Last 3 Encounters:  12/26/20 249 lb 9.6 oz (113.2 kg)  12/13/20 250 lb (113.4 kg)  04/05/20 278 lb 9.6 oz (126.4 kg)    General: Patient appears comfortable at rest. HEENT: Conjunctiva and lids normal, wearing a mask. Neck: Supple, no elevated JVP or carotid bruits, no thyromegaly. Lungs: Clear to auscultation, nonlabored breathing at rest. Cardiac: Regular rate and rhythm, no S3 or significant systolic murmur, no pericardial rub. Extremities: No pitting edema.  ECG:  An ECG dated 02/04/2020 was personally reviewed today and demonstrated:  Sinus rhythm with frequent PVCs.  Recent Labwork: 02/05/2020: ALT 62; AST 20; BUN 6; Creatinine, Ser 0.67; Hemoglobin 11.8; Platelets 315; Potassium 3.5;  Sodium 131   Other Studies Reviewed Today:  Echocardiogram 10/08/2017: - Left ventricle: The cavity size was normal. Wall thickness was    normal. Systolic function was normal. The estimated ejection    fraction was in the range of 60% to 65%. Wall motion was normal;    there were no regional wall motion abnormalities. Left    ventricular diastolic function parameters were normal.  - Aortic valve: Valve area (VTI): 1.81 cm^2. Valve area (Vmax):    1.76 cm^2. Valve area (Vmean): 1.75 cm^2.  - Atrial septum: No defect or patent foramen ovale was identified.    Cardiac monitor September 2019: Sinus rhythm and sinus tachycardia with frequent  PACs and isolated PVCs. Average heart rate 103 bpm. Symptoms primarily correlated with sinus tachycardia and PACs.  Assessment and Plan:  History of palpitations with previously documented frequent PACs and rare PVCs, symptoms are well controlled on combination of Toprol-XL and short acting diltiazem.  Plan is to continue with present therapy and observation for now.  Medication Adjustments/Labs and Tests Ordered: Current medicines are reviewed at length with the patient today.  Concerns regarding medicines are outlined above.   Tests Ordered: No orders of the defined types were placed in this encounter.   Medication Changes: No orders of the defined types were placed in this encounter.   Disposition:  Follow up  1 year.  Signed, Satira Sark, MD, [facility] 12/26/2020 9:39 AM    [facility] at [place], [place], [place] [address] Phone: [phone]; Fax: [phone]

## 2020-12-26 ENCOUNTER — Encounter: Payer: Self-pay | Admitting: Cardiology

## 2020-12-26 ENCOUNTER — Ambulatory Visit (INDEPENDENT_AMBULATORY_CARE_PROVIDER_SITE_OTHER): Payer: Commercial Managed Care - PPO | Admitting: Cardiology

## 2020-12-26 VITALS — BP 134/74 | HR 73 | Ht 65.0 in | Wt 249.6 lb

## 2020-12-26 DIAGNOSIS — R002 Palpitations: Secondary | ICD-10-CM

## 2020-12-26 MED ORDER — METOPROLOL SUCCINATE ER 50 MG PO TB24: 50.0000 mg | ORAL_TABLET | Freq: Every day | ORAL | 2 refills | Status: DC

## 2020-12-26 MED ORDER — DILTIAZEM HCL 30 MG PO TABS: 30.0000 mg | ORAL_TABLET | Freq: Two times a day (BID) | ORAL | 2 refills | Status: DC

## 2020-12-26 NOTE — Patient Instructions (Addendum)

## 2021-01-04 ENCOUNTER — Other Ambulatory Visit: Payer: Self-pay

## 2021-01-04 ENCOUNTER — Ambulatory Visit (AMBULATORY_SURGERY_CENTER): Payer: Commercial Managed Care - PPO | Admitting: Gastroenterology

## 2021-01-04 ENCOUNTER — Encounter: Payer: Self-pay | Admitting: Gastroenterology

## 2021-01-04 VITALS — BP 97/58 | HR 84 | Temp 97.1°F | Resp 12 | Ht 65.0 in | Wt 250.0 lb

## 2021-01-04 DIAGNOSIS — D120 Benign neoplasm of cecum: Secondary | ICD-10-CM

## 2021-01-04 DIAGNOSIS — Z1211 Encounter for screening for malignant neoplasm of colon: Secondary | ICD-10-CM

## 2021-01-04 DIAGNOSIS — K635 Polyp of colon: Secondary | ICD-10-CM

## 2021-01-04 MED ORDER — SODIUM CHLORIDE 0.9 % IV SOLN: 500.0000 mL | Freq: Once | INTRAVENOUS | Status: DC

## 2021-01-04 NOTE — Patient Instructions (Signed)
Handouts on polyps and diverticulosis given to you today  Await pathology results   YOU HAD AN ENDOSCOPIC PROCEDURE TODAY AT [facility]:   Refer to the procedure report that was given to you for any specific questions about what was found during the examination.  If the procedure report does not answer your questions, please call your gastroenterologist to clarify.  If you requested that your care partner not be given the details of your procedure findings, then the procedure report has been included in a sealed envelope for you to review at your convenience later.  YOU SHOULD EXPECT: Some feelings of bloating in the abdomen. Passage of more gas than usual.  Walking can help get rid of the air that was put into your GI tract during the procedure and reduce the bloating. If you had a lower endoscopy (such as a colonoscopy or flexible sigmoidoscopy) you may notice spotting of blood in your stool or on the toilet paper. If you underwent a bowel prep for your procedure, you may not have a normal bowel movement for a few days.  Please Note:  You might notice some irritation and congestion in your nose or some drainage.  This is from the oxygen used during your procedure.  There is no need for concern and it should clear up in a day or so.  SYMPTOMS TO REPORT IMMEDIATELY:  Following lower endoscopy (colonoscopy or flexible sigmoidoscopy):  Excessive amounts of blood in the stool  Significant tenderness or worsening of abdominal pains  Swelling of the abdomen that is new, acute  Fever of 100F or higher  For urgent or emergent issues, a gastroenterologist can be reached at any hour by calling [phone]. Do not use MyChart messaging for urgent concerns.    DIET:  We do recommend a small meal at first, but then you may proceed to your regular diet.  Drink plenty of fluids but you should avoid alcoholic beverages for 24 hours.  ACTIVITY:  You should plan to take it easy for the  rest of today and you should NOT DRIVE or use heavy machinery until tomorrow (because of the sedation medicines used during the test).    FOLLOW UP: Our staff will call the number listed on your records 48-72 hours following your procedure to check on you and address any questions or concerns that you may have regarding the information given to you following your procedure. If we do not reach you, we will leave a message.  We will attempt to reach you two times.  During this call, we will ask if you have developed any symptoms of COVID 19. If you develop any symptoms (ie: fever, flu-like symptoms, shortness of breath, cough etc.) before then, please call [phone].  If you test positive for Covid 19 in the 2 weeks post procedure, please call and report this information to [place].    If any biopsies were taken you will be contacted by phone or by letter within the next 1-3 weeks.  Please call us at [phone] if you have not heard about the biopsies in 3 weeks.    SIGNATURES/CONFIDENTIALITY: You and/or your care partner have signed paperwork which will be entered into your electronic medical record.  These signatures attest to the fact that that the information above on your After Visit Summary has been reviewed and is understood.  Full responsibility of the confidentiality of this discharge information lies with you and/or your care-partner.

## 2021-01-04 NOTE — Progress Notes (Signed)
A and O x3. Report to RN. Tolerated MAC anesthesia well. 

## 2021-01-04 NOTE — Progress Notes (Signed)
Pt's states no medical or surgical changes since previsit or office visit. VS assessed by C.W 

## 2021-01-04 NOTE — Progress Notes (Signed)
[facility] History and Physical   Primary Care Physician:  Sharilyn Sites, MD   Reason for Procedure:   Colon cancer screening   Plan:    colonoscopy     HPI: Kathleen Stokes is a 45 y.o. female  here for colonoscopy screening - first time exam. Patient denies any bowel symptoms at this time. No family history of colon cancer known. Otherwise feels well without any cardiopulmonary symptoms.    Past Medical History:  Diagnosis Date   Asthma    Diabetes mellitus without complication ([place])    GERD (gastroesophageal reflux disease)    Palpitations    Prediabetes     Past Surgical History:  Procedure Laterality Date   CATARACT EXTRACTION Left    CHOLECYSTECTOMY N/A 12/08/2018   Procedure: LAPAROSCOPIC CHOLECYSTECTOMY;  Surgeon: Virl Cagey, MD;  Location: [facility];  Service: General;  Laterality: N/A;    Prior to Admission medications   Medication Sig Start Date End Date Taking? Authorizing Provider  albuterol (PROVENTIL HFA;VENTOLIN HFA) 108 (90 Base) MCG/ACT inhaler Inhale 2 puffs into the lungs every 6 (six) hours as needed for wheezing or shortness of breath. 05/11/18  Yes Herminio Commons, MD  atorvastatin (LIPITOR) 20 MG tablet Take 1 tablet (20 mg total) by mouth daily. For Cholesterol 02/05/20 12/05/22 Yes Emokpae, Courage, MD  diltiazem (CARDIZEM) 30 MG tablet Take 1 tablet (30 mg total) by mouth 2 (two) times daily. 12/26/20  Yes Satira Sark, MD  famotidine (PEPCID) 40 MG tablet Take 40 mg by mouth daily. 12/20/20  Yes [provider]  fluticasone (FLONASE) 50 MCG/ACT nasal spray Place 1 spray into both nostrils daily.    Yes [provider]  metFORMIN (GLUCOPHAGE) 500 MG tablet Take 1 tablet (500 mg total) by mouth 2 (two) times daily. 02/05/20  Yes Emokpae, Courage, MD  metoprolol succinate (TOPROL-XL) 50 MG 24 hr tablet Take 1 tablet (50 mg total) by mouth daily. 12/26/20  Yes Satira Sark, MD  [facility] 5 MG/0.5ML Pen  SMARTSIG:1 pre-filled pen syringe SUB-Q Once a Week 11/18/20  Yes [provider]  pantoprazole (PROTONIX) 40 MG tablet Take 40 mg by mouth 2 (two) times daily. 11/19/20  Yes [provider]    Current Outpatient Medications  Medication Sig Dispense Refill   albuterol (PROVENTIL HFA;VENTOLIN HFA) 108 (90 Base) MCG/ACT inhaler Inhale 2 puffs into the lungs every 6 (six) hours as needed for wheezing or shortness of breath. 1 Inhaler 0   atorvastatin (LIPITOR) 20 MG tablet Take 1 tablet (20 mg total) by mouth daily. For Cholesterol 90 tablet 3   diltiazem (CARDIZEM) 30 MG tablet Take 1 tablet (30 mg total) by mouth 2 (two) times daily. 180 tablet 2   famotidine (PEPCID) 40 MG tablet Take 40 mg by mouth daily.     fluticasone (FLONASE) 50 MCG/ACT nasal spray Place 1 spray into both nostrils daily.      metFORMIN (GLUCOPHAGE) 500 MG tablet Take 1 tablet (500 mg total) by mouth 2 (two) times daily. 180 tablet 2   metoprolol succinate (TOPROL-XL) 50 MG 24 hr tablet Take 1 tablet (50 mg total) by mouth daily. 90 tablet 2   MOUNJARO 5 MG/0.5ML Pen SMARTSIG:1 pre-filled pen syringe SUB-Q Once a Week     pantoprazole (PROTONIX) 40 MG tablet Take 40 mg by mouth 2 (two) times daily.     Current Facility-Administered Medications  Medication Dose Route Frequency Provider Last Rate Last Admin   0.9 %  sodium  chloride infusion  500 mL Intravenous Once Amauris Debois, Carlota Raspberry, MD        Allergies as of 01/04/2021 - Review Complete 01/04/2021  Allergen Reaction Noted   Penicillins Rash 09/29/2017    Family History  Problem Relation Age of Onset   Heart Problems Mother    Gallbladder disease Mother    Colon cancer Neg Hx    Esophageal cancer Neg Hx    Stomach cancer Neg Hx     Social History   Socioeconomic History   Marital status: Single    Spouse name: Not on file   Number of children: Not on file   Years of education: Not on file   Highest education level: Not on file   Occupational History   Not on file  Tobacco Use   Smoking status: Never   Smokeless tobacco: Never  Vaping Use   Vaping Use: Never used  Substance and Sexual Activity   Alcohol use: Not Currently   Drug use: Never   Sexual activity: Not on file  Other Topics Concern   Not on file  Social History Narrative   Not on file   Social Determinants of Health   Financial Resource Strain: Not on file  Food Insecurity: Not on file  Transportation Needs: Not on file  Physical Activity: Not on file  Stress: Not on file  Social Connections: Not on file  Intimate Partner Violence: Not on file    Review of Systems: All other review of systems negative except as mentioned in the HPI.  Physical Exam: Vital signs BP (!) 119/59   Pulse 96   Temp (!) 97.1 F (36.2 C) (Skin)   Ht 5\' 5"  (1.651 m)   Wt 250 lb (113.4 kg)   SpO2 99%   BMI 41.60 kg/m   General:   Alert,  Well-developed, pleasant and cooperative in NAD Lungs:  Clear throughout to auscultation.   Heart:  Regular rate and rhythm Abdomen:  Soft, nontender and nondistended.   Neuro/Psych:  Alert and cooperative. Normal mood and affect. A and O x 3  Jolly Mango, MD [facility]

## 2021-01-04 NOTE — Op Note (Signed)
[place] Patient Name: Kathleen Stokes Procedure Date: 01/04/2021 7:57 AM MRN: [id number] Endoscopist: Remo Lipps P. Havery Moros , MD Age: 45 Referring MD:  Date of Birth: [birth year] Gender: Female Account #: [id number] Procedure:                Colonoscopy Indications:              Screening for colorectal malignant neoplasm, This                            is the patient's first colonoscopy Medicines:                Monitored Anesthesia Care Procedure:                Pre-Anesthesia Assessment:                           - Prior to the procedure, a History and Physical                            was performed, and patient medications and                            allergies were reviewed. The patient's tolerance of                            previous anesthesia was also reviewed. The risks                            and benefits of the procedure and the sedation                            options and risks were discussed with the patient.                            All questions were answered, and informed consent                            was obtained. Prior Anticoagulants: The patient has                            taken no previous anticoagulant or antiplatelet                            agents. ASA Grade Assessment: III - A patient with                            severe systemic disease. After reviewing the risks                            and benefits, the patient was deemed in                            satisfactory condition to undergo the procedure.  After obtaining informed consent, the colonoscope                            was passed under direct vision. Throughout the                            procedure, the patient's blood pressure, pulse, and                            oxygen saturations were monitored continuously. The                            Olympus CF-HQ190L [id number]) Colonoscope was                            introduced through  the anus and advanced to the the                            cecum, identified by appendiceal orifice and                            ileocecal valve. The colonoscopy was performed                            without difficulty. The patient tolerated the                            procedure well. The quality of the bowel                            preparation was good. The ileocecal valve,                            appendiceal orifice, and rectum were photographed. Scope In: 8:01:53 AM Scope Out: 8:16:50 AM Scope Withdrawal Time: 0 hours 12 minutes 36 seconds  Total Procedure Duration: 0 hours 14 minutes 57 seconds  Findings:                 The perianal and digital rectal examinations were                            normal.                           A few small-mouthed diverticula were found in the                            transverse colon and left colon.                           A 5 mm polyp was found in the cecum. The polyp was                            sessile. The polyp was removed with a cold snare.  Resection and retrieval were complete.                           Internal hemorrhoids were found during                            retroflexion. The hemorrhoids were small.                           The exam was otherwise without abnormality. Complications:            No immediate complications. Estimated blood loss:                            Minimal. Estimated Blood Loss:     Estimated blood loss was minimal. Impression:               - Diverticulosis in the transverse colon and in the                            left colon.                           - One 5 mm polyp in the cecum, removed with a cold                            snare. Resected and retrieved.                           - Internal hemorrhoids.                           - The examination was otherwise normal. Recommendation:           - Patient has a contact number available for                             emergencies. The signs and symptoms of potential                            delayed complications were discussed with the                            patient. Return to normal activities tomorrow.                            Written discharge instructions were provided to the                            patient.                           - Resume previous diet.                           - Continue present medications.                           -  Await pathology results. Remo Lipps P. Havery Moros, MD 01/04/2021 8:20:38 AM This report has been signed electronically.

## 2021-01-04 NOTE — Progress Notes (Signed)
Called to room to assist during endoscopic procedure.  Patient ID and intended procedure confirmed with present staff. Received instructions for my participation in the procedure from the performing physician.  

## 2021-01-08 ENCOUNTER — Telehealth: Payer: Self-pay

## 2021-01-08 NOTE — Telephone Encounter (Signed)
  Follow up Call-  Call back number 01/04/2021  Post procedure Call Back phone  # [phone]  Permission to leave phone message Yes  Some recent data might be hidden     Patient questions:  Do you have a fever, pain , or abdominal swelling? No. Pain Score  0 *  Have you tolerated food without any problems? Yes.    Have you been able to return to your normal activities? Yes.    Do you have any questions about your discharge instructions: Diet   No. Medications  No. Follow up visit  No.  Do you have questions or concerns about your Care? No.  Actions: * If pain score is 4 or above: No action needed, pain <4.  Have you developed a fever since your procedure? no  2.   Have you had an respiratory symptoms (SOB or cough) since your procedure? no  3.   Have you tested positive for COVID 19 since your procedure no  4.   Have you had any family members/close contacts diagnosed with the COVID 19 since your procedure?  no   If yes to any of these questions please route to Joylene John, RN and Joella Prince, RN

## 2021-05-21 ENCOUNTER — Other Ambulatory Visit: Payer: Self-pay | Admitting: *Deleted

## 2021-05-21 MED ORDER — METOPROLOL SUCCINATE ER 50 MG PO TB24: 50.0000 mg | ORAL_TABLET | Freq: Every day | ORAL | 2 refills | Status: DC

## 2021-05-21 MED ORDER — DILTIAZEM HCL 30 MG PO TABS: 30.0000 mg | ORAL_TABLET | Freq: Two times a day (BID) | ORAL | 2 refills | Status: DC

## 2021-10-14 IMAGING — CT CT ABD-PELV W/ CM
2 of 5 series · 16 of 46 positions shown, 18 images · IV contrast (Omnipaque or Isovue)
Comparison: None.

CLINICAL DATA: Centralized abdominal pain for 2 days

EXAM:
CT ABDOMEN AND PELVIS WITH CONTRAST
TECHNIQUE: Multidetector CT imaging of the abdomen and pelvis was performed
using the standard protocol following bolus administration of
intravenous contrast.
CONTRAST:  100mL OMNIPAQUE IOHEXOL 300 MG/ML  SOLN

[Series 2: axial st · axial · 0.90mm/px · z∈[-513,-78]mm · 13 of 97 slices shown, 15 images]
[im 5/97  soft-tissue]
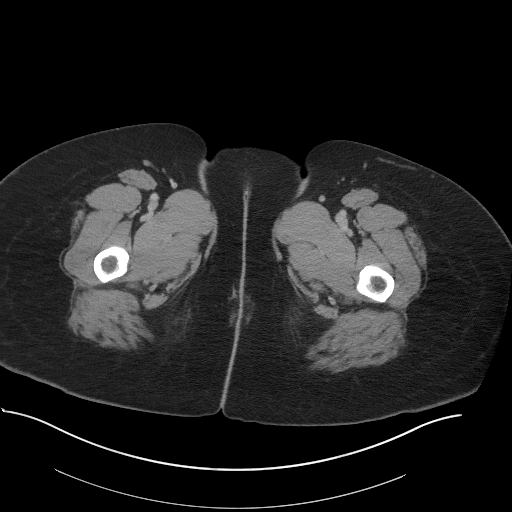
[im 5/97  bone]
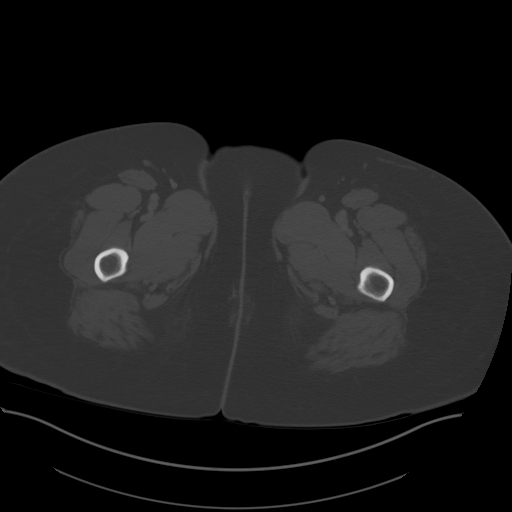
[im 15/97  soft-tissue]
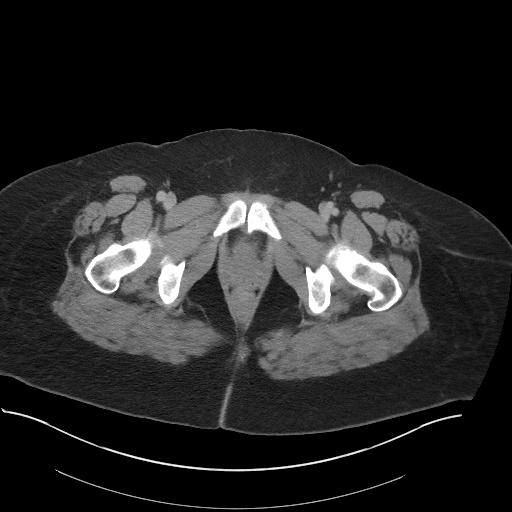
[im 20/97  soft-tissue]
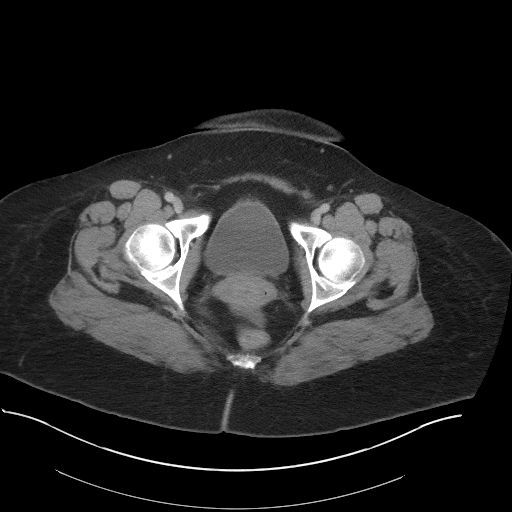
[im 29/97  soft-tissue]
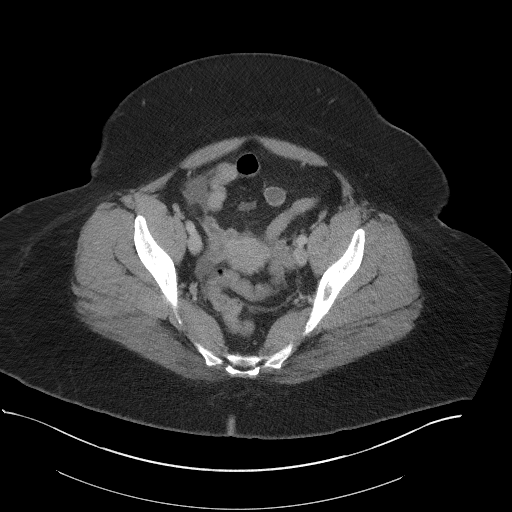
[im 34/97  soft-tissue]
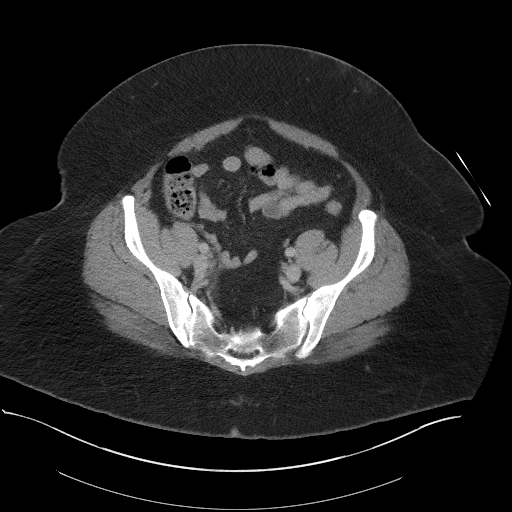
[im 44/97  soft-tissue]
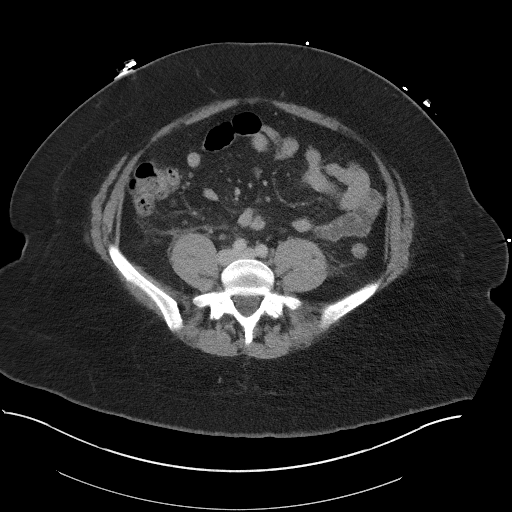
[im 49/97  soft-tissue]
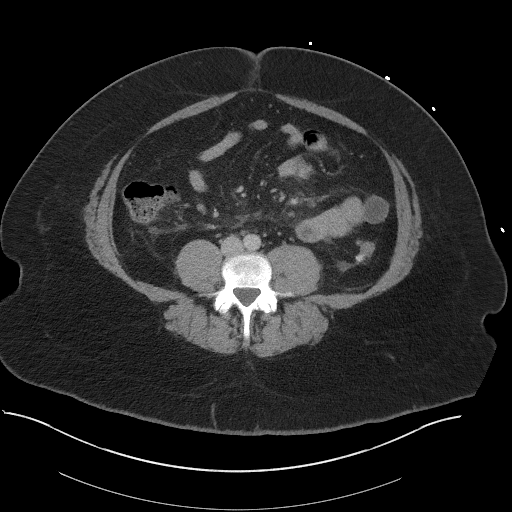
[im 53/97  soft-tissue]
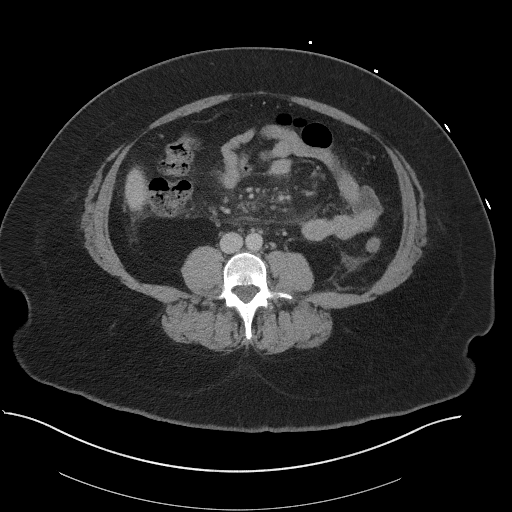
[im 63/97  soft-tissue]
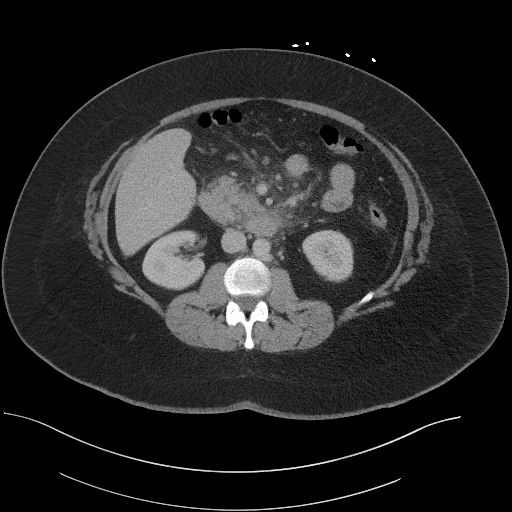
[im 63/97  bone]
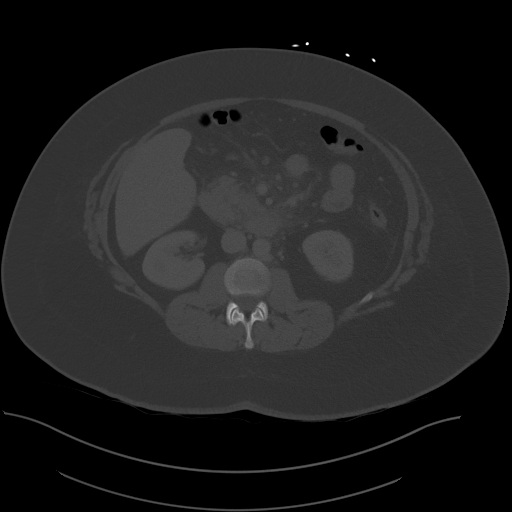
[im 68/97  soft-tissue]
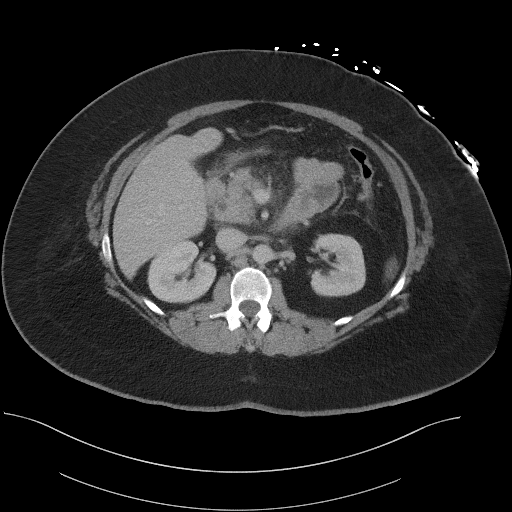
[im 77/97  soft-tissue]
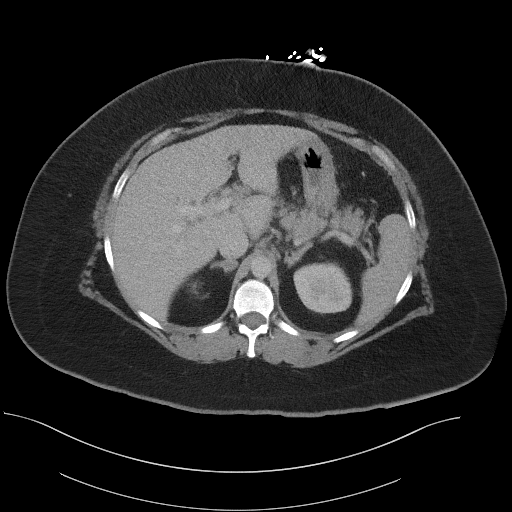
[im 82/97  soft-tissue]
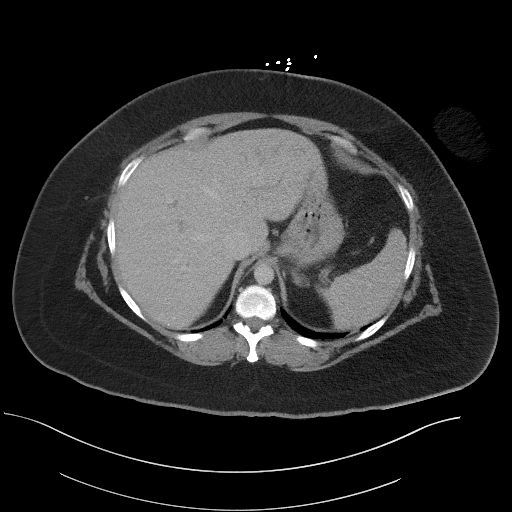
[im 92/97  soft-tissue]
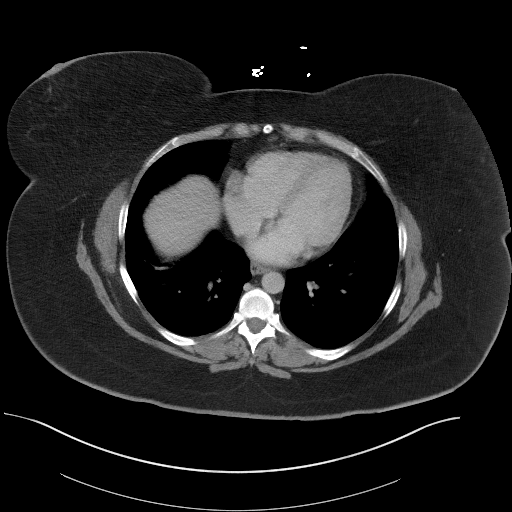

[Series 5: coronal st · coronal · 0.94mm/px · 3 of 126 slices shown]
[im 42/126  soft-tissue]
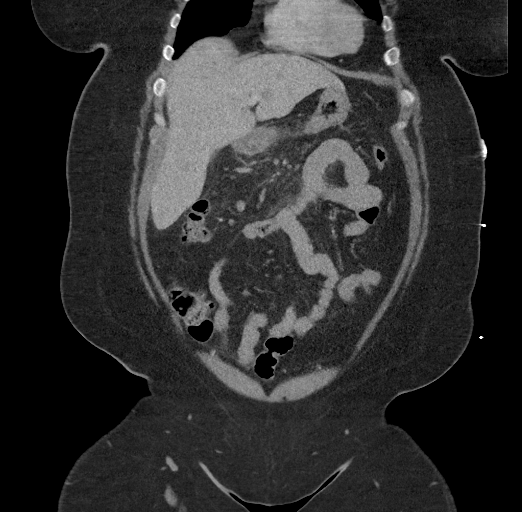
[im 56/126  soft-tissue]
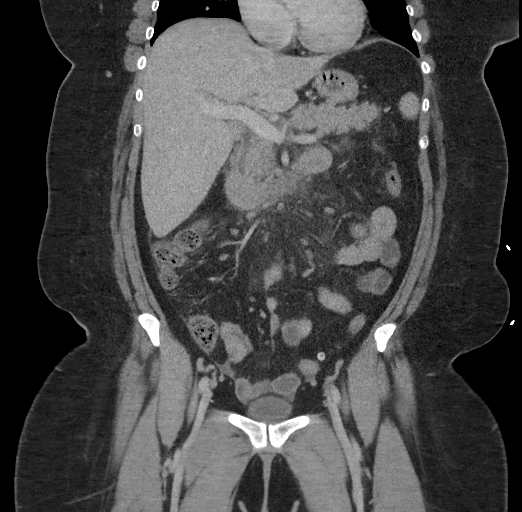
[im 70/126  soft-tissue]
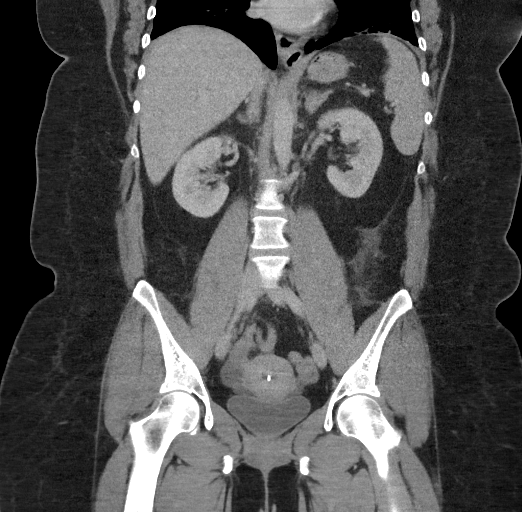

[16 of 46 positions shown; findings below may reference images not displayed]

FINDINGS: Lower chest: No acute abnormality.

Hepatobiliary: Status post cholecystectomy. Mild intrahepatic
biliary dilatation, likely postsurgical. No focal liver lesion
identified.

Pancreas: Mild peripancreatic fat stranding around the pancreatic
head and body. Trace adjacent free fluid. No organized
peripancreatic fluid collection. No parenchymal hypoenhancement. No
ductal dilatation.

Spleen: Normal in size without focal abnormality.

Adrenals/Urinary Tract: Subcentimeter right adrenal gland nodule,
too small to definitively characterize. Kidneys are within normal
limits. No renal lesion, stone, or hydronephrosis. Urinary bladder
is unremarkable.

Stomach/Bowel: Small hiatal hernia. Stomach otherwise unremarkable.
No dilated loops of bowel. Scattered colonic diverticulosis. No
focal bowel wall thickening or inflammatory changes.

Vascular/Lymphatic: No significant vascular findings are identified.
There are numerous nonenlarged mesenteric and retroperitoneal lymph
nodes, likely reactive.

Reproductive: IUD present within the endometrial canal. No adnexal
masses.

Other: Small volume free fluid within the pelvis. No organized
abdominopelvic fluid collection. No pneumoperitoneum.

Musculoskeletal: No acute or significant osseous findings.
IMPRESSION: 1. Peripancreatic fat stranding and trace fluid around the
pancreatic head and body suggestive of acute pancreatitis. No
organized peripancreatic fluid collection.
2. Small volume free fluid within the pelvis, likely reactive.
3. Status post cholecystectomy with mild intrahepatic biliary
dilatation, likely postsurgical.
4. Small hiatal hernia.
5. Scattered colonic diverticulosis without evidence of acute
diverticulitis.

## 2021-10-16 ENCOUNTER — Ambulatory Visit (INDEPENDENT_AMBULATORY_CARE_PROVIDER_SITE_OTHER): Payer: Commercial Managed Care - PPO | Admitting: Internal Medicine

## 2021-10-16 ENCOUNTER — Encounter: Payer: Self-pay | Admitting: Internal Medicine

## 2021-10-16 VITALS — BP 118/78 | HR 84 | Temp 98.5°F | Ht 65.0 in | Wt 240.4 lb

## 2021-10-16 DIAGNOSIS — J4540 Moderate persistent asthma, uncomplicated: Secondary | ICD-10-CM | POA: Diagnosis not present

## 2021-10-16 DIAGNOSIS — J301 Allergic rhinitis due to pollen: Secondary | ICD-10-CM

## 2021-10-16 MED ORDER — ALBUTEROL SULFATE HFA 108 (90 BASE) MCG/ACT IN AERS: 2.0000 | INHALATION_SPRAY | Freq: Four times a day (QID) | RESPIRATORY_TRACT | 11 refills | Status: DC | PRN

## 2021-10-16 MED ORDER — DULERA 200-5 MCG/ACT IN AERO: 2.0000 | INHALATION_SPRAY | Freq: Two times a day (BID) | RESPIRATORY_TRACT | 5 refills | Status: DC

## 2021-10-16 MED ORDER — MONTELUKAST SODIUM 10 MG PO TABS: 10.0000 mg | ORAL_TABLET | Freq: Every day | ORAL | 11 refills | Status: DC

## 2021-10-16 NOTE — Patient Instructions (Addendum)
Please schedule follow up scheduled with myself in 2 months.  If my schedule is not open yet, we will contact you with a reminder closer to that time. Please call [phone] if you haven't heard from [place] a month before.   Stop stiolto inhaler Switch to dulera 200 HFA 2 puffs twice a day, with spacer. Gargle after use. Sent to your pharmacy Start montelukast allergy pill once daily to help with asthma and allergies.  Ok to keep taking anti-histamine daily such as cetirizine/zyrtec    By learning about asthma and how it can be controlled, you take an important step toward managing this disease. Work closely with your asthma care team to learn all you can about your asthma, how to avoid triggers, what your medications do, and how to take them correctly. With proper care, you can live free of asthma symptoms and maintain a normal, healthy lifestyle.   What is asthma? Asthma is a chronic disease that affects the airways of the lungs. During normal breathing, the bands of muscle that surround the airways are relaxed and air moves freely. During an asthma episode or "attack," there are three main changes that stop air from moving easily through the airways: The bands of muscle that surround the airways tighten and make the airways narrow. This tightening is called bronchospasm.  The lining of the airways becomes swollen or inflamed.  The cells that line the airways produce more mucus, which is thicker than normal and clogs the airways.  These three factors - bronchospasm, inflammation, and mucus production - cause symptoms such as difficulty breathing, wheezing, and coughing.  What are the most common symptoms of asthma? Asthma symptoms are not the same for everyone. They can even change from episode to episode in the same person. Also, you may have only one symptom of asthma, such as cough, but another person may have all the symptoms of asthma. It is important to know all the symptoms of asthma and  to be aware that your asthma can present in any of these ways at any time. The most common symptoms include: Coughing, especially at night  Shortness of breath  Wheezing  Chest tightness, pain, or pressure   Who is affected by asthma? Asthma affects 22 million Americans; about 6 million of these are children under age 56. People who have a family history of asthma have an increased risk of developing the disease. Asthma is also more common in people who have allergies or who are exposed to tobacco smoke. However, anyone can develop asthma at any time. Some people may have asthma all of their lives, while others may develop it as adults.  What causes asthma? The airways in a person with asthma are very sensitive and react to many things, or "triggers." Contact with these triggers causes asthma symptoms. One of the most important parts of asthma control is to identify your triggers and then avoid them when possible. The only trigger you do not want to avoid is exercise. Pre-treatment with medicines before exercise can allow you to stay active yet avoid asthma symptoms. Common asthma triggers include: Infections (colds, viruses, flu, sinus infections)  Exercise  Weather (changes in temperature and/or humidity, cold air)  Tobacco smoke  Allergens (dust mites, pollens, pets, mold spores, cockroaches, and sometimes foods)  Irritants (strong odors from cleaning products, perfume, wood smoke, air pollution)  Strong emotions such as crying or laughing hard  Some medications   How is asthma diagnosed? To diagnose asthma, your  doctor will first review your medical history, family history, and symptoms. Your doctor will want to know any past history of breathing problems you may have had, as well as a family history of asthma, allergies, eczema (a bumpy, itchy skin rash caused by allergies), or other lung disease. It is important that you describe your symptoms in detail (cough, wheeze, shortness of  breath, chest tightness), including when and how often they occur. The doctor will perform a physical examination and listen to your heart and lungs. He or she may also order breathing tests, allergy tests, blood tests, and chest and sinus X-rays. The tests will find out if you do have asthma and if there are any other conditions that are contributing factors.  How is asthma treated? Asthma can be controlled, but not cured. It is not normal to have frequent symptoms, trouble sleeping, or trouble completing tasks. Appropriate asthma care will prevent symptoms and visits to the emergency room and hospital. Asthma medicines are one of the mainstays of asthma treatment. The drugs used to treat asthma are explained below.  Anti-inflammatories: These are the most important drugs for most people with asthma. Anti-inflammatory drugs reduce swelling and mucus production in the airways. As a result, airways are less sensitive and less likely to react to triggers. These medications need to be taken daily and may need to be taken for several weeks before they begin to control asthma. Anti-inflammatory medicines lead to fewer symptoms, better airflow, less sensitive airways, less airway damage, and fewer asthma attacks. If taken every day, they CONTROL or prevent asthma symptoms.   Bronchodilators: These drugs relax the muscle bands that tighten around the airways. This action opens the airways, letting more air in and out of the lungs and improving breathing. Bronchodilators also help clear mucus from the lungs. As the airways open, the mucus moves more freely and can be coughed out more easily. In short-acting forms, bronchodilators RELIEVE or stop asthma symptoms by quickly opening the airways and are very helpful during an asthma episode. In long-acting forms, bronchodilators provide CONTROL of asthma symptoms and prevent asthma episodes.  Asthma drugs can be taken in a variety of ways. Inhaling the medications by  using a metered dose inhaler, dry powder inhaler, or nebulizer is one way of taking asthma medicines. Oral medicines (pills or liquids you swallow) may also be prescribed.  Asthma severity Asthma is classified as either "intermittent" (comes and goes) or "persistent" (lasting). Persistent asthma is further described as being mild, moderate, or severe. The severity of asthma is based on how often you have symptoms both during the day and night, as well as by the results of lung function tests and by how well you can perform activities. The "severity" of asthma refers to how "intense" or "strong" your asthma is.  Asthma control Asthma control is the goal of asthma treatment. Regardless of your asthma severity, it may or may not be controlled. Asthma control means: You are able to do everything you want to do at work and home  You have no (or minimal) asthma symptoms  You do not wake up from your sleep or earlier than usual in the morning due to asthma  You rarely need to use your reliever medicine (inhaler)  Another major part of your treatment is that you are happy with your asthma care and believe your asthma is controlled.  Monitoring symptoms A key part of treatment is keeping track of how well your lungs are working. Monitoring your  symptoms, what they are, how and when they happen, and how severe they are, is an important part of being able to control your asthma.  Sometimes asthma is monitored using a peak flow meter. A peak flow (PF) meter measures how fast the air comes out of your lungs. It can help you know when your asthma is getting worse, sometimes even before you have symptoms. By taking daily peak flow readings, you can learn when to adjust medications to keep asthma under good control. It is also used to create your asthma action plan (see below). Your doctor can use your peak flow readings to adjust your treatment plan in some cases.  Asthma Action Plan Based on your history and  asthma severity, you and your doctor will develop a care plan called an "asthma action plan." The asthma action plan describes when and how to use your medicines, actions to take when asthma worsens, and when to seek emergency care. Make sure you understand this plan. If you do not, ask your asthma care provider any questions you may have. Your asthma action plan is one of the keys to controlling asthma. Keep it readily available to remind you of what you need to do every day to control asthma and what you need to do when symptoms occur.  Goals of asthma therapy These are the goals of asthma treatment: Live an active, normal life  Prevent chronic and troublesome symptoms  Attend work or school every day  Perform daily activities without difficulty  Stop urgent visits to the doctor, emergency department, or hospital  Use and adjust medications to control asthma with few or no side effects

## 2021-10-16 NOTE — Addendum Note (Signed)
Addended by: Mathis Bud on: 10/16/2021 12:12 PM   Modules accepted: Orders

## 2021-10-16 NOTE — Addendum Note (Signed)
Addended by: Mathis Bud on: 10/16/2021 12:04 PM   Modules accepted: Orders

## 2021-10-16 NOTE — Progress Notes (Signed)
Kathleen Stokes    [id number]    Jun 26, 1975  Primary Care Physician:Stokes, Kathleen Reichmann, MD  Referring Physician: Jake Samples, PA-C [address] [place],  [place] [address] Reason for Consultation: uncontrolled asthma Date of Consultation: 10/16/2021  Chief complaint:   Chief Complaint  Patient presents with   Consult    Asthma has changed last 8 mths., sob, cough-yellow, wheezing     HPI:  Kathleen Stokes is a 46 y.o. woman with poorly controlled asthma here for new patient evaluation.   Symptoms started with recurrent bronchitis in her 63s. That's when she was first diagnosed. She would use albuterol inhaler once in a while (just a couple times a year.) needed prednisone 5 times since January of this year.   In the last year symptoms have become poorly controlled. Symptoms always resolve with steroids and breathing treatments.   Symptoms including wheezing, chest tightness, shortness of breath, coughing.   Using albuterol inhaler multiple times 7-8 times/day. Has had some break through palpitations on this.   She was on advair for 3 months but lost her sense of taste and smell and had to stop taking it in august. Switched to [employer]. Also got a round of prednisone then.   She had heart fluttering with loratidine as well as leg cramps.   Current Regimen: stiolto 2 puffs once a day. Albuterol multiple times a day.  Asthma Triggers: chlorox Exacerbations in the last year: 5 times since January 2023.  History of hospitalization or intubation: Allergy Testing: never had GERD: yes on PPI Allergic Rhinitis: previously has taken flonase.  ACT:  Asthma Control Test ACT Total Score  10/16/2021  9:24 AM 7   FeNO: 43 ppb  Social history:  Occupation: works as Corporate treasurer, works in [employer], [employer].  Exposures: lives at home with parents, son, 2 dogs (has had for over 10 years.) Smoking history: never smoker. Passive smoke exposure in spouse and childhood previously,  none now.   Social History   Occupational History   Not on file  Tobacco Use   Smoking status: Never   Smokeless tobacco: Never  Vaping Use   Vaping Use: Never used  Substance and Sexual Activity   Alcohol use: Not Currently   Drug use: Never   Sexual activity: Not on file    Relevant family history:  Family History  Problem Relation Age of Onset   Heart Problems Mother    Gallbladder disease Mother    Emphysema Maternal Grandmother    Colon cancer Neg Hx    Esophageal cancer Neg Hx    Stomach cancer Neg Hx     Past Medical History:  Diagnosis Date   Asthma    Diabetes mellitus without complication ([place])    GERD (gastroesophageal reflux disease)    Palpitations    Prediabetes     Past Surgical History:  Procedure Laterality Date   CATARACT EXTRACTION Left    CHOLECYSTECTOMY N/A 12/08/2018   Procedure: LAPAROSCOPIC CHOLECYSTECTOMY;  Surgeon: Virl Cagey, MD;  Location: [facility];  Service: General;  Laterality: N/A;     Physical Exam: Blood pressure 118/78, pulse 84, temperature 98.5 F (36.9 C), temperature source Temporal, height '5\' 5"'$  (1.651 m), weight 240 lb 6.4 oz (109 kg), SpO2 98 %. Gen:      No acute distress, frequent coughing ENT:  +cobblestoning, mallampati IV, no nasal polyps, mucus membranes moist Lungs:    No increased respiratory effort, symmetric chest wall excursion, clear  to auscultation bilaterally, end expiratory wheezing noted CV:         Regular rate and rhythm; no murmurs, rubs, or gallops.  No pedal edema Abd:      + bowel sounds; soft, non-tender; no distension MSK: no acute synovitis of DIP or PIP joints, no mechanics hands.  Skin:      Warm and dry; no rashes Neuro: normal speech, no focal facial asymmetry Psych: alert and oriented x3, normal mood and affect   Data Reviewed/Medical Decision Making:  Independent interpretation of tests: Imaging:  Review of patient's Chest xray Jan 2022  images revealed no acute process. The  patient's images have been independently reviewed by me.    PFTs: I have personally reviewed the patient's PFTs and no airflow limitation on spirometry 10/2021.   Labs:  Lab Results  Component Value Date   WBC 16.9 (H) 02/05/2020   HGB 11.8 (L) 02/05/2020   HCT 36.5 02/05/2020   MCV 83.7 02/05/2020   PLT 315 02/05/2020   Lab Results  Component Value Date   NA 131 (L) 02/05/2020   K 3.5 02/05/2020   CL 104 02/05/2020   CO2 23 02/05/2020     Immunization status:  Immunization History  Administered Date(s) Administered   Influenza,inj,Quad PF,6+ Mos 11/10/2017   Janssen (J&J) SARS-COV-2 Vaccination 01/12/2020     I reviewed prior external note(s) from PCP [facility] in [place]  I reviewed the result(s) of the labs and imaging as noted above.   I have ordered region 2 allergy panel, PFT   Assessment:  Moderate persistent asthma, not well controlled Allergic rhinitis not well controlled Elevated FeNO GERD  Plan/Recommendations: Stop stiolto inhaler Switch to dulera 200 HFA 2 puffs twice a day, with spacer. Gargle after use.  I suggested starting montelukast allergy pill once daily but she declined stating an adverse reaction in the past that she cannot recall.  Get region 2 allergy testing today.  Ok to keep taking anti-histamine daily such as cetirizine/zyrtec Continue PPI  We discussed disease management and progression at length today.    Return to Care: Return in about 2 months (around 12/16/2021).  Kathleen Llamas, MD Pulmonary and [place]  CC: Kathleen Stokes, [place]*

## 2021-10-16 NOTE — Addendum Note (Signed)
Addended by: Lenice Llamas on: 10/16/2021 10:48 AM   Modules accepted: Orders

## 2021-10-17 LAB — RESPIRATORY ALLERGY PROFILE REGION II ~~LOC~~
Allergen, A. alternata, m6: <0.1 kU/L
Allergen, Cedar tree, t12: <0.1 kU/L
Allergen, Comm Silver Birch, t9: <0.1 kU/L
Allergen, Cottonwood, t14: <0.1 kU/L
Allergen, D pternoyssinus,d7: <0.1 kU/L
Allergen, Mouse Urine Protein, e78: <0.1 kU/L
Allergen, Mulberry, t76: <0.1 kU/L
Allergen, Oak,t7: <0.1 kU/L
Allergen, P. notatum, m1: <0.1 kU/L
Aspergillus fumigatus, m3: <0.1 kU/L
Bermuda Grass: <0.1 kU/L
Box Elder IgE: <0.1 kU/L
CLADOSPORIUM HERBARUM (M2) IGE: <0.1 kU/L
COMMON RAGWEED (SHORT) (W1) IGE: <0.1 kU/L
Cat Dander: <0.1 kU/L
Class: 0
Class: 0
Class: 0
Class: 0
Class: 0
Class: 0
Class: 0
Class: 0
Class: 0
Class: 0
Class: 0
Class: 0
Class: 0
Class: 0
Class: 0
Class: 0
Class: 0
Class: 0
Class: 0
Class: 0
Class: 0
Class: 0
Class: 0
Class: 0
Cockroach: <0.1 kU/L
D. farinae: <0.1 kU/L
Dog Dander: <0.1 kU/L
Elm IgE: <0.1 kU/L
IgE (Immunoglobulin E), Serum: 230 kU/L — ABNORMAL HIGH (ref <=114)
Johnson Grass: <0.1 kU/L
Pecan/Hickory Tree IgE: <0.1 kU/L
Rough Pigweed  IgE: <0.1 kU/L
Sheep Sorrel IgE: <0.1 kU/L
Timothy Grass: <0.1 kU/L

## 2021-10-17 LAB — INTERPRETATION:

## 2021-12-12 ENCOUNTER — Ambulatory Visit (INDEPENDENT_AMBULATORY_CARE_PROVIDER_SITE_OTHER): Payer: Commercial Managed Care - PPO | Admitting: Internal Medicine

## 2021-12-12 ENCOUNTER — Encounter: Payer: Self-pay | Admitting: Internal Medicine

## 2021-12-12 VITALS — BP 116/76 | HR 76 | Temp 98.6°F | Ht 65.0 in | Wt 240.6 lb

## 2021-12-12 DIAGNOSIS — J4540 Moderate persistent asthma, uncomplicated: Secondary | ICD-10-CM | POA: Diagnosis not present

## 2021-12-12 DIAGNOSIS — K219 Gastro-esophageal reflux disease without esophagitis: Secondary | ICD-10-CM

## 2021-12-12 DIAGNOSIS — J301 Allergic rhinitis due to pollen: Secondary | ICD-10-CM | POA: Diagnosis not present

## 2021-12-12 DIAGNOSIS — Z23 Encounter for immunization: Secondary | ICD-10-CM

## 2021-12-12 NOTE — Progress Notes (Signed)
Kathleen Stokes    [id number]    Jan 10, 1976  Primary Care Physician:Golding, Jenny Reichmann, MD Date of Appointment: 12/12/2021 Established Patient Visit  Chief complaint:   Chief Complaint  Patient presents with   Follow-up    Doing well.  No sx noted today.  Interested in getting flu vaccine today.     HPI: Kathleen Stokes is a 46 y.o. woman with moderate persistent asthma.   Interval Updates: Here for follow up after starting maintenance inhaler with dulera 200 hfa 2 puffs twice a day.  No albuterol use since starting this. No thrush or adverse effects.    Current Regimen: dulera 200 mg 2 puffs twice a day.  Asthma Triggers: chlorox, seasonal allergies Exacerbations in the last year: 5 times since January 2023.  History of hospitalization or intubation: none Allergy Testing: never had GERD: yes on PPI Allergic Rhinitis: previously has taken flonase.  ACT: Asthma Control Test ACT Total Score  12/12/2021  9:55 AM 21  10/16/2021  9:24 AM 7    I have reviewed the patient's family social and past medical history and updated as appropriate.   Past Medical History:  Diagnosis Date   Asthma    Diabetes mellitus without complication ([facility])    GERD (gastroesophageal reflux disease)    Palpitations    Prediabetes     Past Surgical History:  Procedure Laterality Date   CATARACT EXTRACTION Left    CHOLECYSTECTOMY N/A 12/08/2018   Procedure: LAPAROSCOPIC CHOLECYSTECTOMY;  Surgeon: Virl Cagey, MD;  Location: [facility];  Service: General;  Laterality: N/A;    Family History  Problem Relation Age of Onset   Heart Problems Mother    Gallbladder disease Mother    Emphysema Maternal Grandmother    Colon cancer Neg Hx    Esophageal cancer Neg Hx    Stomach cancer Neg Hx     Social History   Occupational History   Not on file  Tobacco Use   Smoking status: Never    Passive exposure: Never   Smokeless tobacco: Never  Vaping Use   Vaping Use: Never used   Substance and Sexual Activity   Alcohol use: Not Currently   Drug use: Never   Sexual activity: Not on file     Physical Exam: Blood pressure 116/76, pulse 76, temperature 98.6 F (37 C), temperature source Oral, height '5\' 5"'$  (1.651 m), weight 240 lb 9.6 oz (109.1 kg), SpO2 99 %.  Gen:      No acute distress ENT:  no nasal polyps, mucus membranes moist Lungs:    No increased respiratory effort, symmetric chest wall excursion, clear to auscultation bilaterally, no wheezes or crackles CV:         Regular rate and rhythm; no murmurs, rubs, or gallops.  No pedal edema   Data Reviewed: Imaging: I have personally reviewed the   PFTs:      No data to display         I have personally reviewed the patient's PFTs and spirometry 10/2021 - no airflow limitation  Labs: IgE - 230, no specific aeroallergen sensitivity   Immunization status: Immunization History  Administered Date(s) Administered   Influenza,inj,Quad PF,6+ Mos 11/10/2017   Janssen (J&J) SARS-COV-2 Vaccination 01/12/2020    External Records Personally Reviewed:   Assessment:  Moderate persistent asthma, controlled GERD - controlled Seasonal allergic rhinitis controlled  Plan/Recommendations: Continue dulera with albuterol as needed.  Continue PPI Continue cetirizine  Flu shot today.  Return to Care: Return in about 6 months (around 06/12/2022).   Lenice Llamas, MD Pulmonary and [place]

## 2021-12-12 NOTE — Patient Instructions (Signed)
Please schedule follow up scheduled with myself in 6 months.  If my schedule is not open yet, we will contact you with a reminder closer to that time. Please call [phone] if you haven't heard from [place] a month before.    Continue dulera with albuterol as needed.  Continue PPI Continue cetirizine

## 2022-01-27 ENCOUNTER — Other Ambulatory Visit: Payer: Self-pay | Admitting: Cardiology

## 2022-02-27 ENCOUNTER — Other Ambulatory Visit: Payer: Self-pay | Admitting: Cardiology

## 2022-03-14 ENCOUNTER — Ambulatory Visit: Payer: Commercial Managed Care - PPO | Admitting: Cardiology

## 2022-03-24 ENCOUNTER — Ambulatory Visit: Payer: Commercial Managed Care - PPO | Attending: Cardiology | Admitting: Nurse Practitioner

## 2022-03-24 ENCOUNTER — Encounter: Payer: Self-pay | Admitting: Nurse Practitioner

## 2022-03-24 VITALS — BP 108/72 | HR 72 | Ht 65.0 in | Wt 243.2 lb

## 2022-03-24 DIAGNOSIS — R002 Palpitations: Secondary | ICD-10-CM | POA: Diagnosis not present

## 2022-03-24 MED ORDER — METOPROLOL SUCCINATE ER 50 MG PO TB24: 50.0000 mg | ORAL_TABLET | Freq: Every day | ORAL | 3 refills | Status: DC

## 2022-03-24 MED ORDER — DILTIAZEM HCL 30 MG PO TABS: ORAL_TABLET | ORAL | 3 refills | Status: DC

## 2022-03-24 NOTE — Progress Notes (Unsigned)
Cardiology Office Note:    Date: 03/24/2022  ID:  Kathleen Stokes, DOB [birth year], MRN [id number]  PCP:  Sharilyn Sites, [place] Providers Cardiologist:  Rozann Lesches, MD     Referring MD: Sharilyn Sites, MD   CC: 1 year follow-up  History of Present Illness:    Kathleen Stokes is a 47 y.o. female with a hx of the following:   Palpitations Type 2 diabetes GERD Asthma  Patient is a 47 year old female with past medical history as mentioned above.  Previous monitor in 2019 showed sinus rhythm to sinus tachycardia with frequent PACs and isolated PVCs, average heart rate was 103 bpm, symptoms primarily correlated with sinus tach/PACs.  Last seen by Dr. Domenic Polite on December 26, 2020.  Palpitations were well-controlled on diltiazem and Toprol-XL.  She was doing well from a cardiac perspective.  Denied any changes to her health since last visit.  No change to his medical therapy were made.  Was told to follow-up in 1 year.  Today she presents for 1 year follow-up.  She states she is doing well. Had Covid around 2 weeks ago, recovered well. Denies any chest pain, shortness of breath, palpitations, syncope, presyncope, dizziness, orthopnea, PND, swelling or significant weight changes, acute bleeding, or claudication.  SH: Works for [employer] in [place]. She enjoys shopping in her free time.   Past Medical History:  Diagnosis Date   Asthma    Diabetes mellitus without complication ([place])    GERD (gastroesophageal reflux disease)    Palpitations    Prediabetes     Past Surgical History:  Procedure Laterality Date   CATARACT EXTRACTION Left    CHOLECYSTECTOMY N/A 12/08/2018   Procedure: LAPAROSCOPIC CHOLECYSTECTOMY;  Surgeon: Virl Cagey, MD;  Location: [facility];  Service: General;  Laterality: N/A;    Current Medications: Current Meds  Medication Sig   albuterol (VENTOLIN HFA) 108 (90 Base) MCG/ACT inhaler Inhale 2 puffs into the  lungs every 6 (six) hours as needed for wheezing or shortness of breath.   atorvastatin (LIPITOR) 20 MG tablet Take 1 tablet (20 mg total) by mouth daily. For Cholesterol   famotidine (PEPCID) 40 MG tablet Take 40 mg by mouth daily.   metFORMIN (GLUCOPHAGE) 500 MG tablet Take 500 mg by mouth daily with breakfast.   mometasone-formoterol (DULERA) 200-5 MCG/ACT AERO Inhale 2 puffs into the lungs in the morning and at bedtime.   montelukast (SINGULAIR) 10 MG tablet Take 10 mg by mouth at bedtime.   pantoprazole (PROTONIX) 40 MG tablet Take 40 mg by mouth 2 (two) times daily.   Semaglutide,0.25 or 0.5MG/DOS, (OZEMPIC, 0.25 OR 0.5 MG/DOSE,) 2 MG/1.5ML SOPN Inject 1 mg into the skin once a week.   diltiazem (CARDIZEM) 30 MG tablet TAKE 1 TABLET BY MOUTH TWICE DAILY . APPOINTMENT REQUIRED FOR FUTURE REFILLS    metoprolol succinate (TOPROL-XL) 50 MG 24 hr tablet Take 1 tablet by mouth once daily     Allergies:   Penicillins   Social History   Socioeconomic History   Marital status: Single    Spouse name: Not on file   Number of children: Not on file   Years of education: Not on file   Highest education level: Not on file  Occupational History   Not on file  Tobacco Use   Smoking status: Never    Passive exposure: Never   Smokeless tobacco: Never  Vaping Use   Vaping Use: Never used  Substance and Sexual  Activity   Alcohol use: Not Currently   Drug use: Never   Sexual activity: Not on file  Other Topics Concern   Not on file  Social History Narrative   Not on file   Social Determinants of Health   Financial Resource Strain: Not on file  Food Insecurity: Not on file  Transportation Needs: Not on file  Physical Activity: Not on file  Stress: Not on file  Social Connections: Not on file     Family History: The patient's family history includes Emphysema in her maternal grandmother; Gallbladder disease in her mother; Heart Problems in her mother. There is no history of Colon  cancer, Esophageal cancer, or Stomach cancer.  ROS:   Please see the history of present illness.     All other systems reviewed and are negative.  EKGs/Labs/Other Studies Reviewed:    The following studies were reviewed today:   EKG:  EKG is ordered today.  The ekg ordered today demonstrates NSR, 72 bpm, otherwise nothing acute.    Cardiac monitor on 11/03/2017:  Sinus rhythm and sinus tachycardia with frequent PACs and isolated PVCs. Average heart rate 103 bpm. Symptoms primarily correlated with sinus tachycardia and PACs.  Echocardiogram on 10/08/2017:  Study Conclusions   - Left ventricle: The cavity size was normal. Wall thickness was    normal. Systolic function was normal. The estimated ejection    fraction was in the range of 60% to 65%. Wall motion was normal;    there were no regional wall motion abnormalities. Left    ventricular diastolic function parameters were normal.  - Aortic valve: Valve area (VTI): 1.81 cm^2. Valve area (Vmax):    1.76 cm^2. Valve area (Vmean): 1.75 cm^2.  - Atrial septum: No defect or patent foramen ovale was identified.  Recent Labs: No results found for requested labs within last 365 days.  Recent Lipid Panel No results found for: "CHOL", "TRIG", "HDL", "CHOLHDL", "VLDL", "LDLCALC", "LDLDIRECT"   Physical Exam:    VS:  BP 108/72   Pulse 72   Ht 5' 5"$  (1.651 m)   Wt 243 lb 3.2 oz (110.3 kg)   SpO2 97%   BMI 40.47 kg/m     Wt Readings from Last 3 Encounters:  03/24/22 243 lb 3.2 oz (110.3 kg)  12/12/21 240 lb 9.6 oz (109.1 kg)  10/16/21 240 lb 6.4 oz (109 kg)     GEN: Morbidly obese, 47 y.o. female in no acute distress HEENT: Normal NECK: No JVD; No carotid bruits CARDIAC: S1/S2, RRR, no murmurs, rubs, gallops RESPIRATORY:  Clear to auscultation without rales, wheezing or rhonchi  MUSCULOSKELETAL:  No edema; No deformity  SKIN: Warm and dry NEUROLOGIC:  Alert and oriented x 3 PSYCHIATRIC:  Normal affect   ASSESSMENT:     1. Palpitations   2. Morbid obesity ([place])    PLAN:    In order of problems listed above:  Palpitations Hx of documented frequent PACs/rare PVCs. Denies any palpitations or tachycardia. Doing well on current medications. Will refill these medications per her request. Will continue to monitor. Heart healthy diet and regular cardiovascular exercise encouraged.   Morbid obesity  BMI today is 40.47. Weight loss via diet and exercise encouraged. Discussed the impact being overweight would have on cardiovascular risk. Given information for PREP program, states she will think about it and let us know if she is interested. Heart healthy diet and regular cardiovascular exercise encouraged.     3. Disposition: Follow-up in 1 year with  Dr. McDowell/[facility] or sooner if anything changes.    Medication Adjustments/Labs and Tests Ordered: Current medicines are reviewed at length with the patient today.  Concerns regarding medicines are outlined above.  Orders Placed This Encounter  Procedures   EKG 12-Lead   Meds ordered this encounter  Medications   metoprolol succinate (TOPROL-XL) 50 MG 24 hr tablet    Sig: Take 1 tablet (50 mg total) by mouth daily.    Dispense:  90 tablet    Refill:  3   diltiazem (CARDIZEM) 30 MG tablet    Sig: TAKE 1 TABLET BY MOUTH TWICE DAILY .    Dispense:  180 tablet    Refill:  3    Patient Instructions  Medication Instructions:  Your physician recommends that you continue on your current medications as directed. Please refer to the Current Medication list given to you today.  *If you need a refill on your cardiac medications before your next appointment, please call your pharmacy*   Lab Work: NONE   If you have labs (blood work) drawn today and your tests are completely normal, you will receive your results only by: [place] (if you have MyChart) OR A paper copy in the mail If you have any lab test that is abnormal or we need to change your  treatment, we will call you to review the results.   Testing/Procedures: NONE    Follow-Up: At [facility], you and your health needs are our priority.  As part of our continuing mission to provide you with exceptional heart care, we have created designated Provider Care Teams.  These Care Teams include your primary Cardiologist (physician) and Advanced Practice Providers (APPs -  Physician Assistants and Nurse Practitioners) who all work together to provide you with the care you need, when you need it.  We recommend signing up for the patient portal called "MyChart".  Sign up information is provided on this After Visit Summary.  MyChart is used to connect with patients for Virtual Visits (Telemedicine).  Patients are able to view lab/test results, encounter notes, upcoming appointments, etc.  Non-urgent messages can be sent to your provider as well.   To learn more about what you can do with MyChart, go to [email or web address].    Your next appointment:   1 year(s)  Provider:   Rozann Lesches, MD    Other Instructions Thank you for choosing [facility]!      Signed, Finis Bud, NP  03/26/2022 9:52 AM    [place]

## 2022-03-24 NOTE — Patient Instructions (Signed)
Medication Instructions:  Your physician recommends that you continue on your current medications as directed. Please refer to the Current Medication list given to you today.  *If you need a refill on your cardiac medications before your next appointment, please call your pharmacy*   Lab Work: NONE   If you have labs (blood work) drawn today and your tests are completely normal, you will receive your results only by: [place] (if you have MyChart) OR A paper copy in the mail If you have any lab test that is abnormal or we need to change your treatment, we will call you to review the results.   Testing/Procedures: NONE    Follow-Up: At [facility], you and your health needs are our priority.  As part of our continuing mission to provide you with exceptional heart care, we have created designated Provider Care Teams.  These Care Teams include your primary Cardiologist (physician) and Advanced Practice Providers (APPs -  Physician Assistants and Nurse Practitioners) who all work together to provide you with the care you need, when you need it.  We recommend signing up for the patient portal called "MyChart".  Sign up information is provided on this After Visit Summary.  MyChart is used to connect with patients for Virtual Visits (Telemedicine).  Patients are able to view lab/test results, encounter notes, upcoming appointments, etc.  Non-urgent messages can be sent to your provider as well.   To learn more about what you can do with MyChart, go to [email or web address].    Your next appointment:   1 year(s)  Provider:   Rozann Lesches, MD    Other Instructions Thank you for choosing [facility]!

## 2022-04-03 ENCOUNTER — Encounter: Payer: Self-pay | Admitting: Radiology

## 2022-08-29 ENCOUNTER — Encounter: Payer: Self-pay | Admitting: Internal Medicine

## 2022-08-29 ENCOUNTER — Telehealth: Payer: Self-pay | Admitting: Pharmacist

## 2022-08-29 ENCOUNTER — Ambulatory Visit: Payer: Commercial Managed Care - PPO | Admitting: Internal Medicine

## 2022-08-29 VITALS — BP 122/60 | HR 83 | Temp 98.0°F | Ht 65.0 in | Wt 241.6 lb

## 2022-08-29 DIAGNOSIS — J4550 Severe persistent asthma, uncomplicated: Secondary | ICD-10-CM | POA: Diagnosis not present

## 2022-08-29 DIAGNOSIS — J309 Allergic rhinitis, unspecified: Secondary | ICD-10-CM | POA: Diagnosis not present

## 2022-08-29 DIAGNOSIS — J00 Acute nasopharyngitis [common cold]: Secondary | ICD-10-CM

## 2022-08-29 MED ORDER — AZELASTINE-FLUTICASONE 137-50 MCG/ACT NA SUSP: 1.0000 | Freq: Two times a day (BID) | NASAL | 5 refills | Status: DC

## 2022-08-29 NOTE — Patient Instructions (Addendum)
Please schedule follow up scheduled with myself in 3 months.  If my schedule is not open yet, we will contact you with a reminder closer to that time. Please call [phone] if you haven't heard from [place] a month before.   Because of your poorly controlled asthma We need to step up your therapy.  Continue dulera 2 puffs twice a day.  Continue albuterol up to 4 times a day as needed.   Stop the oxymetolazone or affrin nasal spray.  Start taking dymista nasal spray I am trying to get an injectable medication called tespire approved for your asthma.  Our pharmacy team will be in touch with you.   Dymista - 1 spray on each side of your nose twice a day for first week, then 1 spray on each side.   Instructions for use: If you also use a saline nasal spray or rinse, use that first. Position the head with the chin slightly tucked. Use the right hand to spray into the left nostril and the right hand to spray into the left nostril.   Point the bottle away from the septum of your nose (cartilage that divides the two sides of your nose).  Hold the nostril closed on the opposite side from where you will spray Spray once and gently sniff to pull the medicine into the higher parts of your nose.  Don't sniff too hard as the medicine will drain down the back of your throat instead. Repeat with a second spray on the same side if prescribed. Repeat on the other side of your nose.

## 2022-08-29 NOTE — Telephone Encounter (Addendum)
Pending OV note, please start Tezspire BIV  Dose: 210mg  SQ every 4 weeks    ----- Message from Murrell Redden sent at 08/29/2022  1:49 PM EDT -----  ----- Message ----- From: Charlott Holler, MD Sent: 08/29/2022   1:44 PM EDT To: Murrell Redden, RPH-CPP  New start tespire

## 2022-08-29 NOTE — Progress Notes (Signed)
Lakeda Bajo    [id number]    [birth year]  Primary Care Physician:Golding, Jonny Ruiz, MD Date of Appointment: 08/29/2022 Established Patient Visit  Chief complaint:   Chief Complaint  Patient presents with   Acute Visit    States wheezing, cough with yellow sputum     HPI: Katlyn Landuyt is a 47 y.o. woman with moderate persistent asthma.   Interval Updates: Here for follow up. Has had poorly controlled asthma. Three exacerbations since January 2023 requiring prednisone or abx.  Has been adherent to dulera. No thrush.  Having lots of drainage and sinus issues.  Has been taking generic oxymetazoline on and off since January.    Current Regimen: dulera 200 mg 2 puffs twice a day.  Asthma Triggers: chlorox, seasonal allergies Exacerbations in the last year: 8 times since January 2023.  History of hospitalization or intubation: none Allergy Testing: never had GERD: yes on PPI Allergic Rhinitis: on montelukast, cetirzine, fluticasone ACT: Asthma Control Test ACT Total Score  12/12/2021  9:55 AM 21  10/16/2021  9:24 AM 7    I have reviewed the patient's family social and past medical history and updated as appropriate.   Past Medical History:  Diagnosis Date   Asthma    Diabetes mellitus without complication (HCC)    GERD (gastroesophageal reflux disease)    Palpitations    Prediabetes     Past Surgical History:  Procedure Laterality Date   CATARACT EXTRACTION Left    CHOLECYSTECTOMY N/A 12/08/2018   Procedure: LAPAROSCOPIC CHOLECYSTECTOMY;  Surgeon: Lucretia Roers, MD;  Location: [facility];  Service: General;  Laterality: N/A;    Family History  Problem Relation Age of Onset   Heart Problems Mother    Gallbladder disease Mother    Emphysema Maternal Grandmother    Colon cancer Neg Hx    Esophageal cancer Neg Hx    Stomach cancer Neg Hx     Social History   Occupational History   Not on file  Tobacco Use   Smoking status: Never    Passive  exposure: Never   Smokeless tobacco: Never  Vaping Use   Vaping status: Never Used  Substance and Sexual Activity   Alcohol use: Not Currently   Drug use: Never   Sexual activity: Not on file     Physical Exam: Blood pressure 122/60, pulse 83, temperature 98 F (36.7 C), temperature source Oral, height 5\' 5"  (1.651 m), weight 241 lb 9.6 oz (109.6 kg), SpO2 97%.  Gen:      No acute distress ENT:  no nasal polyps, mucus membranes moist, inflamed nasal turbinades Lungs:    mild end expiratory wheeze CV:         RRR   Data Reviewed: Imaging: I have personally reviewed the chest xray Jan 2022 - no acute process.   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  Continue dulera 2 puffs twice a day.  Continue albuterol up to 4 times a day as needed.   Stop the oxymetolazone or affrin nasal spray.  Start taking dymista nasal spray I am trying to get an injectable medication called tespire approved for your asthma.  Our pharmacy team will be in touch with you.   Return to Care: Return in about 3 months (around 11/29/2022).   Durel Salts, MD Pulmonary and Critical Care Medicine [facility] [phone]

## 2022-09-01 ENCOUNTER — Other Ambulatory Visit (HOSPITAL_COMMUNITY): Payer: Self-pay

## 2022-09-01 ENCOUNTER — Telehealth: Payer: Self-pay

## 2022-09-01 NOTE — Telephone Encounter (Signed)
Submitted a Prior Authorization request to [employer] for TEZSPIRE via CoverMyMeds. Will update once we receive a response.  Key: [id number]  Per automated response: [employer] does not manage PA for this patient. Please contact the number on the back of the members card for further assistance  [facility] phone: [phone]  Called [facility] to initiate PA for Tezspire SQ on the phone. Right as automated system was about to transfer me to customer service agent, the line was automatically hung up. Will ATC again at later pt  Chesley Mires, PharmD, MPH, BCPS, CPP Clinical Pharmacist (Rheumatology and Pulmonology)

## 2022-09-01 NOTE — Telephone Encounter (Signed)
*  Pulm  Pharmacy Patient Advocate Encounter   Received notification from CoverMyMeds that prior authorization for Azelastine-Fluticasone 137-50MCG/ACT suspension  is required/requested.   Insurance verification completed.   The patient is insured through [employer] .   Per test claim: PA required; PA submitted to [facility] via CoverMyMeds Key/confirmation #/EOC [id number] Status is pending   Pharmacy Patient Advocate Encounter  Received notification from [facility] that Prior Authorization for Azelastine-Fluticasone 137-50MCG/ACT suspension  has been APPROVED from 09/01/2022 to 08/31/2023. Ran test claim, Copay is $10.00/days.  PA #/Case ID/Reference #: Kayren Eaves

## 2022-09-03 NOTE — Telephone Encounter (Signed)
Tezspire Together enrollment form received and placed in "PAP Pending" folder.

## 2022-09-03 NOTE — Telephone Encounter (Signed)
Called [facility] to initiate PA for Tezspire SQ. Per rep, must initiate through [employer] [facility] with form. Form printed and faxed with clinicals  Phone: [phone] Fax: [phone]  Chesley Mires, PharmD, MPH, BCPS, CPP Clinical Pharmacist (Rheumatology and Pulmonology)

## 2022-09-04 NOTE — Telephone Encounter (Signed)
Received fax from [employer] [facility] requesting CBC w diff and PFTs to be faxed  Phone: [phone] Fax: [phone]

## 2022-09-08 NOTE — Telephone Encounter (Signed)
Received a fax regarding Prior Authorization from  [employer]  for TEZSPIRE. Authorization has been DENIED because TEZSPIRE SQ.  Per claims history, the most recent paid claim for [facility] was on 07/03/2022 for 30-day supply. If patient does not benefit from standard asthma treatments ICS +/- (LABA, leukotriene receptor antagonist, LABA/LABA, or all of above) then the patient may be eligible for IL-5i (Fasenra or [place]) OR IL-4i (Dupixent)  Chesley Mires, PharmD, MPH, BCPS, CPP Clinical Pharmacist (Rheumatology and Pulmonology)

## 2022-09-10 ENCOUNTER — Telehealth: Payer: Self-pay | Admitting: Pharmacist

## 2022-09-10 NOTE — Telephone Encounter (Signed)
Patient did not qualify for tespire based on insurance formulary. She is on dulera.she needs biologic therapy for glucorticoid dependent asthma. Please order dupixent instead.

## 2022-09-10 NOTE — Telephone Encounter (Signed)
  [facility] Care PA Form printed and faxed with clinicals- for Dupixent 300mg . Will update once we receive a response.   Phone: [phone] Fax: [phone]

## 2022-09-10 NOTE — Telephone Encounter (Signed)
[facility] Care PA Form printed and faxed with clinicals- for Dupixent 300mg . Will update once we receive a response.   Phone: [phone] Fax: [phone]

## 2022-09-16 NOTE — Telephone Encounter (Signed)
Received a fax regarding Prior Authorization from  [place] [employer] [facility]  for DUPIXENT. Authorization has been DENIED.  Per claims history, the most recent paid claim for [facility] was on 07/03/2022 for 30-day supply. If patient does not benefit from standard asthma treatments ICS +/- (LABA, leukotriene receptor antagonist, LABA/LABA, or all of above) then records must be provided indicating fill history. To appeal, will need letter of medical necessity and submit medical records substantiating need for requested drug explicitly including but not limited to: - failure of recommended formulary alternatives - clinical rationale to skip step therapy - contraindication or other reasons why patient cannot take alternative treatments  All medical occurrences must be supported by medical records.  Phone# [phone] Fax# [phone]  ATC patient to discuss and ascertain adherence but patient was not home. Requested call back later  Chesley Mires, PharmD, MPH, BCPS, CPP Clinical Pharmacist (Rheumatology and Pulmonology)

## 2022-09-17 ENCOUNTER — Telehealth: Payer: Self-pay | Admitting: Pharmacist

## 2022-09-17 DIAGNOSIS — J4550 Severe persistent asthma, uncomplicated: Secondary | ICD-10-CM

## 2022-09-17 NOTE — Telephone Encounter (Signed)
Patient reporting $95 copay for [facility]. She therefore has not picked up rx from pharmacy since May 2024  Could you please check high-dose ICS/LABA costs for patients so we can determine most affordable option?  Can route back to Dr. Celine Mans and Clyda Greener, CMA with results.  Thanks!  Chesley Mires, PharmD, MPH, BCPS, CPP Clinical Pharmacist (Rheumatology and Pulmonology)

## 2022-09-17 NOTE — Telephone Encounter (Signed)
Called patient regarding inhaler adherence. She states she is taking Dulera - 2 puffs once daily only because she has been unable to fill inhaler. Patient states that her copay for [facility] was $95 with copay card because of deductible potentially ? She did not inquire further about this from pharmacy. If deductible issues, she may run into this with any ICS/LABA. She states that she believes she took Symbicort and definitely took Advair Diskus (may have lost taste from Advair). Patient appears to be poor historian of treatment history.  I reviewed that we'd like her adherent on inhaler for at least 3months ideally before escalating treatment to biologic.  Will have PA team run BIV on high-dose ICS/LABAs  Chesley Mires, PharmD, MPH, BCPS, CPP Clinical Pharmacist (Rheumatology and Pulmonology)

## 2022-09-17 NOTE — Telephone Encounter (Signed)
PT calling back. Confused if Devki was going to order her Symbicort as an alternative. Please call PT @ [phone] is her cell and preferred #

## 2022-09-18 ENCOUNTER — Other Ambulatory Visit: Payer: Self-pay

## 2022-09-18 NOTE — Telephone Encounter (Signed)
Per my note, we need to see what inhaler is most affordable for her through insurance since she does not want to pay $95 for [facility]

## 2022-09-18 NOTE — Telephone Encounter (Signed)
Cheapest high dose alternative is the 160mg  dose Symbicort at $10.00 at this time.

## 2022-09-22 MED ORDER — BUDESONIDE-FORMOTEROL FUMARATE 160-4.5 MCG/ACT IN AERO: 2.0000 | INHALATION_SPRAY | Freq: Two times a day (BID) | RESPIRATORY_TRACT | 1 refills | Status: DC

## 2022-09-22 NOTE — Telephone Encounter (Signed)
Rx for Symbicort 160-4.19mcg 2 puffs twice daily sent to [facility].MyChart message sent to pt

## 2022-10-03 ENCOUNTER — Telehealth: Payer: Self-pay | Admitting: Internal Medicine

## 2022-10-03 NOTE — Telephone Encounter (Signed)
I called the pt and there was no answer- LMTCB. ?

## 2022-10-03 NOTE — Telephone Encounter (Signed)
Pt. Having side affects to her inhaler and nasal spray and needs med advise

## 2023-03-07 ENCOUNTER — Other Ambulatory Visit: Payer: Self-pay | Admitting: Nurse Practitioner

## 2023-03-16 ENCOUNTER — Other Ambulatory Visit: Payer: Self-pay | Admitting: Internal Medicine

## 2023-03-16 DIAGNOSIS — J4550 Severe persistent asthma, uncomplicated: Secondary | ICD-10-CM

## 2023-05-18 ENCOUNTER — Other Ambulatory Visit: Payer: Self-pay | Admitting: Internal Medicine

## 2023-05-18 NOTE — Telephone Encounter (Signed)
 Copied from CRM [id number]. Topic: Clinical - Medication Refill >> May 18, 2023  8:01 AM Roseanne Cones wrote: Most Recent Primary Care Visit:   Medication: albuterol (VENTOLIN HFA) 108 (90 Base) MCG/ACT inhaler   Has the patient contacted their pharmacy? Yes, advised that they were out of refills and needs to call us . (Agent: If no, request that the patient contact the pharmacy for the refill. If patient does not wish to contact the pharmacy document the reason why and proceed with request.) (Agent: If yes, when and what did the pharmacy advise?)  Is this the correct pharmacy for this prescription? Yes If no, delete pharmacy and type the correct one.  This is the patient's preferred pharmacy:  [facility] [address] [place] - [address] [place] [address] [place] [address] [place] [address] Phone: [phone] Fax: [phone]  [facility] - [place], [place] - [address] [place] [address] Phone: [phone] Fax: [phone]   Has the prescription been filled recently? No  Is the patient out of the medication? No  Has the patient been seen for an appointment in the last year OR does the patient have an upcoming appointment? Yes  Can we respond through MyChart? Yes  Agent: Please be advised that Rx refills may take up to 3 business days. We ask that you follow-up with your pharmacy.

## 2023-05-19 MED ORDER — ALBUTEROL SULFATE HFA 108 (90 BASE) MCG/ACT IN AERS: 2.0000 | INHALATION_SPRAY | Freq: Four times a day (QID) | RESPIRATORY_TRACT | 11 refills | Status: AC | PRN

## 2023-06-02 ENCOUNTER — Encounter: Payer: Self-pay | Admitting: Cardiology

## 2023-06-02 ENCOUNTER — Ambulatory Visit: Attending: Cardiology | Admitting: Cardiology

## 2023-06-02 VITALS — BP 114/72 | HR 86 | Ht 65.0 in | Wt 241.8 lb

## 2023-06-02 DIAGNOSIS — E118 Type 2 diabetes mellitus with unspecified complications: Secondary | ICD-10-CM

## 2023-06-02 DIAGNOSIS — E782 Mixed hyperlipidemia: Secondary | ICD-10-CM

## 2023-06-02 DIAGNOSIS — R002 Palpitations: Secondary | ICD-10-CM | POA: Diagnosis not present

## 2023-06-02 MED ORDER — METOPROLOL SUCCINATE ER 50 MG PO TB24: 50.0000 mg | ORAL_TABLET | Freq: Every day | ORAL | 2 refills | Status: DC

## 2023-06-02 MED ORDER — DILTIAZEM HCL 30 MG PO TABS: 30.0000 mg | ORAL_TABLET | Freq: Two times a day (BID) | ORAL | 2 refills | Status: AC

## 2023-06-02 NOTE — Progress Notes (Signed)
    Cardiology Office Note  Date: 06/02/2023   ID: Gretna Rothermich, DOB 1975-08-19, MRN [id number]  History of Present Illness: Kathleen Stokes is a 48 y.o. female last seen by Ms. Clementine Cutting NP in February 2024, I reviewed her note.  I have not seen her since 2022.  She is here for a routine visit.  Reports very infrequent brief palpitations, no increasing pattern, no sudden dizziness or syncope.  She continues to work for the [employer].  We went over her medications.  She remains on Cardizem  30 mg twice daily and Toprol -XL 50 mg daily.  I reviewed her interval lab work per PCP as noted below.  She continues to follow with Dr. Glady Laming.  I reviewed her ECG today which shows normal sinus rhythm.  Physical Exam: VS:  BP 114/72   Pulse 86   Ht 5\' 5"  (1.651 m)   Wt 241 lb 12.8 oz (109.7 kg)   SpO2 95%   BMI 40.24 kg/m , BMI Body mass index is 40.24 kg/m.  Wt Readings from Last 3 Encounters:  06/02/23 241 lb 12.8 oz (109.7 kg)  08/29/22 241 lb 9.6 oz (109.6 kg)  03/24/22 243 lb 3.2 oz (110.3 kg)    General: Patient appears comfortable at rest. HEENT: Conjunctiva and lids normal. Lungs: Clear to auscultation, nonlabored breathing at rest. Cardiac: Regular rate and rhythm, no S3 or significant systolic murmur.  ECG:  An ECG dated 03/24/2022 was personally reviewed today and demonstrated:  Sinus rhythm.  Labwork:  July 2024: Hemoglobin 14.2, platelets 455, BUN 14, creatinine 0.89, potassium 4.4, AST 20, ALT 23, cholesterol 131, triglycerides 66, HDL 55, LDL 62, TSH 1.49, hemoglobin A1c 5.8%  Other Studies Reviewed Today:  No interval cardiac testing for review today.  Assessment and Plan:  1.  Palpitations with previously documented PACs and PVCs by cardiac monitor.  No increasing pattern or intensity on current regimen including Cardizem  30 mg twice daily and Toprol -XL 50 mg daily.  ECG is normal today.  Continue observation.  2.  Type 2 diabetes mellitus, currently on  Glucophage  500 mg daily and Mounjaro per PCP.  Hemoglobin A1c 5.8% in July 2024.  3.  Mixed hyperlipidemia, on Lipitor 20 mg daily.  LDL 62 and HDL 55 in July 4098.  Disposition:  Follow up  1 year.  Signed, Gerard Knight, M.D., F.A.C.C. [facility]

## 2023-06-02 NOTE — Patient Instructions (Signed)

## 2024-02-23 ENCOUNTER — Other Ambulatory Visit: Payer: Self-pay | Admitting: Cardiology

## 2024-02-25 ENCOUNTER — Other Ambulatory Visit: Payer: Self-pay | Admitting: Cardiology

## 2024-02-25 ENCOUNTER — Encounter: Payer: Self-pay | Admitting: Cardiology

## 2024-02-25 NOTE — Telephone Encounter (Signed)
 error
